# Patient Record
Sex: Male | Born: 1945 | Race: White | Hispanic: No | Marital: Married | Smoking: Never smoker
Health system: Southern US, Community
[De-identification: ages and names within clinical notes are randomized; demographics above are authoritative.]

## PROBLEM LIST (undated history)

## (undated) DIAGNOSIS — K589 Irritable bowel syndrome without diarrhea: Secondary | ICD-10-CM

## (undated) DIAGNOSIS — K449 Diaphragmatic hernia without obstruction or gangrene: Secondary | ICD-10-CM

## (undated) DIAGNOSIS — M1990 Unspecified osteoarthritis, unspecified site: Secondary | ICD-10-CM

## (undated) DIAGNOSIS — Z8719 Personal history of other diseases of the digestive system: Secondary | ICD-10-CM

## (undated) DIAGNOSIS — E785 Hyperlipidemia, unspecified: Secondary | ICD-10-CM

## (undated) DIAGNOSIS — I8390 Asymptomatic varicose veins of unspecified lower extremity: Secondary | ICD-10-CM

## (undated) DIAGNOSIS — I2510 Atherosclerotic heart disease of native coronary artery without angina pectoris: Secondary | ICD-10-CM

## (undated) DIAGNOSIS — I10 Essential (primary) hypertension: Secondary | ICD-10-CM

## (undated) DIAGNOSIS — D126 Benign neoplasm of colon, unspecified: Secondary | ICD-10-CM

## (undated) DIAGNOSIS — M6530 Trigger finger, unspecified finger: Secondary | ICD-10-CM

## (undated) DIAGNOSIS — K219 Gastro-esophageal reflux disease without esophagitis: Secondary | ICD-10-CM

## (undated) HISTORY — PX: TONSILLECTOMY: SUR1361

## (undated) HISTORY — DX: Essential (primary) hypertension: I10

## (undated) HISTORY — DX: Benign neoplasm of colon, unspecified: D12.6

## (undated) HISTORY — DX: Asymptomatic varicose veins of unspecified lower extremity: I83.90

## (undated) HISTORY — DX: Trigger finger, unspecified finger: M65.30

## (undated) HISTORY — PX: LUMBAR SPINE SURGERY: SHX701

## (undated) HISTORY — DX: Irritable bowel syndrome, unspecified: K58.9

## (undated) HISTORY — DX: Hyperlipidemia, unspecified: E78.5

## (undated) HISTORY — DX: Personal history of other diseases of the digestive system: Z87.19

## (undated) HISTORY — DX: Unspecified osteoarthritis, unspecified site: M19.90

## (undated) HISTORY — PX: HYDROCELE EXCISION / REPAIR: SUR1145

## (undated) HISTORY — DX: Diaphragmatic hernia without obstruction or gangrene: K44.9

## (undated) HISTORY — DX: Atherosclerotic heart disease of native coronary artery without angina pectoris: I25.10

## (undated) HISTORY — DX: Gastro-esophageal reflux disease without esophagitis: K21.9

---

## 1997-08-31 ENCOUNTER — Inpatient Hospital Stay (HOSPITAL_COMMUNITY): Admission: RE | Admit: 1997-08-31 | Discharge: 1997-09-01 | Payer: Self-pay | Admitting: Neurosurgery

## 1997-10-27 ENCOUNTER — Encounter: Admission: RE | Admit: 1997-10-27 | Discharge: 1998-01-25 | Payer: Self-pay | Admitting: Neurosurgery

## 2003-07-13 ENCOUNTER — Ambulatory Visit (HOSPITAL_COMMUNITY): Admission: RE | Admit: 2003-07-13 | Discharge: 2003-07-13 | Payer: Self-pay | Admitting: Urology

## 2003-07-13 ENCOUNTER — Ambulatory Visit (HOSPITAL_BASED_OUTPATIENT_CLINIC_OR_DEPARTMENT_OTHER): Admission: RE | Admit: 2003-07-13 | Discharge: 2003-07-13 | Payer: Self-pay | Admitting: Urology

## 2003-09-14 ENCOUNTER — Ambulatory Visit (HOSPITAL_COMMUNITY): Admission: RE | Admit: 2003-09-14 | Discharge: 2003-09-15 | Payer: Self-pay | Admitting: Neurosurgery

## 2006-11-06 ENCOUNTER — Ambulatory Visit: Payer: Self-pay | Admitting: Internal Medicine

## 2006-11-14 ENCOUNTER — Ambulatory Visit: Payer: Self-pay | Admitting: Internal Medicine

## 2007-11-27 DIAGNOSIS — K649 Unspecified hemorrhoids: Secondary | ICD-10-CM | POA: Insufficient documentation

## 2007-11-27 DIAGNOSIS — K589 Irritable bowel syndrome without diarrhea: Secondary | ICD-10-CM | POA: Insufficient documentation

## 2007-11-27 DIAGNOSIS — K219 Gastro-esophageal reflux disease without esophagitis: Secondary | ICD-10-CM | POA: Insufficient documentation

## 2007-11-27 DIAGNOSIS — D126 Benign neoplasm of colon, unspecified: Secondary | ICD-10-CM | POA: Insufficient documentation

## 2007-11-28 ENCOUNTER — Ambulatory Visit: Payer: Self-pay | Admitting: Internal Medicine

## 2007-11-28 DIAGNOSIS — R1013 Epigastric pain: Secondary | ICD-10-CM | POA: Insufficient documentation

## 2007-12-03 ENCOUNTER — Ambulatory Visit (HOSPITAL_COMMUNITY): Admission: RE | Admit: 2007-12-03 | Discharge: 2007-12-03 | Payer: Self-pay | Admitting: Internal Medicine

## 2007-12-23 ENCOUNTER — Ambulatory Visit: Payer: Self-pay | Admitting: Internal Medicine

## 2007-12-24 ENCOUNTER — Encounter: Payer: Self-pay | Admitting: Internal Medicine

## 2007-12-24 ENCOUNTER — Encounter: Admission: RE | Admit: 2007-12-24 | Discharge: 2007-12-24 | Payer: Self-pay | Admitting: Surgery

## 2007-12-24 ENCOUNTER — Telehealth: Payer: Self-pay | Admitting: Gastroenterology

## 2008-02-11 ENCOUNTER — Encounter: Payer: Self-pay | Admitting: Internal Medicine

## 2008-02-24 ENCOUNTER — Encounter: Payer: Self-pay | Admitting: Internal Medicine

## 2008-07-13 ENCOUNTER — Inpatient Hospital Stay (HOSPITAL_COMMUNITY): Admission: EM | Admit: 2008-07-13 | Discharge: 2008-07-14 | Payer: Self-pay | Admitting: Emergency Medicine

## 2008-07-13 ENCOUNTER — Ambulatory Visit: Payer: Self-pay | Admitting: Cardiology

## 2008-07-14 ENCOUNTER — Encounter: Payer: Self-pay | Admitting: Cardiology

## 2008-07-14 ENCOUNTER — Ambulatory Visit: Payer: Self-pay | Admitting: Surgery

## 2008-07-14 HISTORY — PX: CARDIAC CATHETERIZATION: SHX172

## 2008-07-14 HISTORY — PX: TRANSTHORACIC ECHOCARDIOGRAM: SHX275

## 2009-05-01 HISTORY — PX: CARDIAC CATHETERIZATION: SHX172

## 2010-05-31 NOTE — Procedures (Signed)
Summary: colonoscopy   Colonoscopy  Procedure date:  11/14/2006  Findings:      Results: Polyp.  Location:  [facility].    Comments:      Repeat colonoscopy in 5 years.   Procedures Next Due Date:    Colonoscopy: 11/2011 Patient Name: Joseph Reilly, Joseph Reilly. MRN:  Procedure Procedures: Colonoscopy CPT: 956 [id number].    with polypectomy. CPT: [id number].  Personnel: Endoscopist: Wilhemina Bonito. Marina Goodell, MD.  Exam Location: Exam performed in Outpatient Clinic. Outpatient  Patient Consent: Procedure, Alternatives, Risks and Benefits discussed, consent obtained, from patient. Consent was obtained by the RN.  Indications  Surveillance of: Adenomatous Polyp(s). This is an initial surveillance exam. Initial polypectomy was performed in 2004. in Jan. 1-2 Polyps were found at Index Exam. Largest polyp removed was 1 to 5 mm. Prior polyp located in proximal (splenic flexure and beyond) colon. Pathology of worst  polyp: tubular adenoma.  History  Current Medications: Patient is not currently taking Coumadin.  Pre-Exam Physical: Performed Nov 14, 2006. Cardio-pulmonary exam, Rectal exam, HEENT exam , Abdominal exam, Mental status exam WNL.  Comments: Pt. history reviewed/updated, physical exam performed prior to initiation of sedation?yes Exam Exam: Extent of exam reached: Cecum, extent intended: Cecum.  The cecum was identified by appendiceal orifice and IC valve. Patient position: on left side. Time to Cecum: 00:01:56. Time for Withdrawl: 00:12:04. Colon retroflexion performed. Images taken. ASA Classification: II. Tolerance: excellent.  Monitoring: Pulse and BP monitoring, Oximetry used. Supplemental O2 given.  Colon Prep Used Miralax for colon prep. Prep results: excellent.  Sedation Meds: Patient assessed and found to be appropriate for moderate (conscious) sedation. Fentanyl 75 mcg. given IV. Versed 8 mg. given IV.  Findings POLYP: Ascending Colon, Maximum size: 5 mm.  Procedure:  snare without cautery, removed, retrieved, Polyp sent to pathology. ICD9: Colon Polyps: 211.3.  NORMAL EXAM: Cecum to Rectum.  POLYP: Ascending Colon, Maximum size: 1 mm. Procedure:  snare without cautery, removed, not retrieved, ICD9: Colon Polyps: 211.3.   Assessment  Diagnoses: 211.3: Colon Polyps.   Events  Unplanned Interventions: No intervention was required.  Unplanned Events: There were no complications. Plans Disposition: After procedure patient sent to recovery. After recovery patient sent home.  Scheduling/Referral: Colonoscopy, to Wilhemina Bonito. Marina Goodell, MD, in 5 years,

## 2010-05-31 NOTE — Assessment & Plan Note (Signed)
Summary: RECURRENT ESOPHAGUS PROBS/MEDS/FH   History of Present Illness Visit Type: new patient Primary GI MD: Yancey Flemings MD Primary Provider: Lanell Persons MD Chief Complaint: reflux problems History of Present Illness:   65 year old white male who presents for evaluation of recurrent epigastric pain. The patient describes epigastric discomfort 6-10 times per year. He describes it as a "flushing feeling". There is occasional radiation to the back. Some soreness touch. No associated nausea or vomiting. He does have a history of gastroesophageal reflux disease for which he takes omeprazole. Occasional breakthrough symptoms with dietary indiscretion. No dysphagia. His last endoscopy was over 20 years ago. His last abdominal ultrasound over 10 years ago. Aside from occasional hemorrhoids, his GI review of systems is otherwise negative. He was having pain about 3 weeks ago when it is appointment. This discomfort lasted about a half a day and resolved. No recurrent symptoms since. He has undergone prior screening colonoscopy in July of 2008. The examination was normal except for diminutive colon polyps found to be adenomatous. Followup in 5 years recommended. Terms of his pain, he cannot identify relieving or aggravating factors. He does not keep him from sleeping. No fevers or weight loss.   GI Review of Systems    Reports acid reflux, belching, bloating, and  chest pain.      Denies abdominal pain, dysphagia with liquids, dysphagia with solids, heartburn, loss of appetite, nausea, vomiting, vomiting blood, weight loss, and  weight gain.      Reports hemorrhoids.     Denies anal fissure, black tarry stools, change in bowel habit, constipation, diarrhea, diverticulosis, fecal incontinence, heme positive stool, irritable bowel syndrome, jaundice, light color stool, liver problems, rectal bleeding, and  rectal pain.  Colonoscopy  Procedure date:  11/14/2006  Findings:      Diminutive adenomas  removed. Otherwise negative.     Prior Medications Reviewed Using: List Brought by Patient  Updated Prior Medication List: AMITRIPTYLINE HCL 25 MG  TABS (AMITRIPTYLINE HCL) 1/4 tab once daily PRILOSEC OTC 20 MG  TBEC (OMEPRAZOLE MAGNESIUM) 1 once daily NASALCROM 5.2 MG/ACT  AERS (CROMOLYN SODIUM) as needed  Current Allergies (reviewed today): No known allergies   Past Medical History:    Reviewed history from 11/27/2007 and no changes required:       Current Problems:        HEMORRHOIDS (ICD-455.6)       IBS (ICD-564.1)       ADENOMATOUS COLONIC POLYP (ICD-211.3)       GERD (ICD-530.81)       Hyperlipidemia  Past Surgical History:    Reviewed history from 11/27/2007 and no changes required:       tonsillectomy       lumbar spine surgery  1999       Hydrocele repair 2005   Family History:    Reviewed history and no changes required:       No FH of Colon Cancer:       Family History of Heart Disease: mother  Social History:    Reviewed history and no changes required:       Occupation: retired Printmaker       Patient has never smoked.        Alcohol Use - no       Daily Caffeine Use 1 per day       Illicit Drug Use - no       Patient does not get regular exercise.    Risk Factors:  Tobacco use:  never Drug use:  no Alcohol use:  no Exercise:  no  Colonoscopy History:     Date of Last Colonoscopy:  11/14/2006    Results:  Diminutive adenomas removed. Otherwise negative.    Review of Systems       Back pain, hearing problems, sleeping problems.All systems otherwise negative   Vital Signs:  Patient Profile:   65 Years Old Male Height:     71 inches Weight:      207.50 pounds BMI:     29.04 Pulse rate:   68 / minute Pulse rhythm:   regular BP sitting:   144 / 86  (left arm)  Vitals Entered By: Chales Abrahams CMA (November 28, 2007 9:08 AM)                  Physical Exam  General:     Well developed, well nourished, no acute distress. Head:      Normocephalic and atraumatic. Eyes:     PERRLA, no icterus. Nose:     No deformity, discharge,  or lesions. Mouth:     No deformity or lesions, dentition normal. Neck:     Supple; no masses or thyromegaly. Chest Wall:     Symmetrical,  no deformities . Lungs:     Clear throughout to auscultation. Heart:     Regular rate and rhythm; no murmurs, rubs,  or bruits. Abdomen:     Soft, nontender and nondistended. No masses, hepatosplenomegaly or hernias noted. Normal bowel sounds. Msk:     Symmetrical with no gross deformities. Normal posture. Pulses:     Normal pulses noted. Extremities:     No clubbing, cyanosis, edema or deformities noted. Neurologic:     Alert and  oriented x4;  grossly normal neurologically. Skin:     Intact without significant lesions or rashes. Psych:     Alert and cooperative. Normal mood and affect.    Impression & Recommendations:  Problem # 1:  ABDOMINAL PAIN-EPIGASTRIC (ICD-789.06) Recurrent problems with abdominal pain of uncertain cause. No worrisome features such as bleeding or weight loss. Rule out biliary colic, breakthrough reflux, symptomatic hiatal hernia, musculoskeletal discomfort.  PLAN: #1. Scheduled normal ultrasound to rule out gallstones #2. Scheduled endoscopy to exclude acid peptic disorders or more sinister diagnoses #3. Continue omeprazole #4. Reflux precautions  Problem # 2:  GERD (ICD-530.81) On omeprazole. May need dose adjustment if reflux felt to be a cause for symptoms. Endoscopy is planned above. No change in dosage at this time. Continue reflux precautions.  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  Other Orders: Ultrasound Abdomen (UAS)     ] Copy to Dr. Lanell Persons

## 2010-05-31 NOTE — Progress Notes (Signed)
  Phone Note Call from Patient   Caller: Patient Reason for Call: Talk to Doctor Summary of Call: ON CALL note. Pt callled at 3:10 am today. Pt. had EGD by Dr. Marina Goodell on Monday, no dilation. Notes back pain between his shoulder blades that woke him from sleep. No other complaints. Pt. and his wife relate that Dr. Marina Goodell told them that he had gallstone symtoms and should see a surgeon. Suspect biliary symptom or musculoskeletal are most likely. Advised to remain on clear liquids until the pain abates and to take ibuprofen 600mg  now. Call me or office later today for further advice from Dr. Marina Goodell. If pain worsens he should go to the ED for acute evaluation. Initial call taken by: Meryl Dare MD Clementeen Graham,  December 24, 2007 9:02 AM      Appended Document:  Elnita Maxwell, this patient has gallstones. He was seen yesterday in the endoscopy center. He was to have surgical referral. See if he has a surgical appointment yet. It may need to be moved up since he is experiencing acute symptoms. Possibly, they could see him today. Thanks  Appended Document:  12-24-07 Patient scheduled for an appt at [facility] today at 2:45pm as a work-in with Dr Jamey Ripa for severe abd pain, gallstones. Patient notified of appt and time. Records will be faxed to [facility]. Ulis Rias RN

## 2010-05-31 NOTE — Consult Note (Signed)
Summary: gallbladder problem/[facility]  gallbladder problem/[facility]   Imported By: Lester Buckhead 01/16/2008 11:57:13  _____________________________________________________________________  External Attachment:    Type:   Image     Comment:   External Document

## 2010-05-31 NOTE — Procedures (Signed)
Summary: Colonoscopy   EGD  Procedure date:  12/23/2007  Findings:      Location: [facility]     Patient Name: Joseph Reilly, Joseph Reilly. MRN:  Procedure Procedures: Panendoscopy (EGD) CPT: 43235.  Personnel: Endoscopist: Wilhemina Bonito. Marina Goodell, MD.  Referred By: Lanell Persons, MD.  Exam Location: Exam performed in Outpatient Clinic. Outpatient  Patient Consent: Procedure, Alternatives, Risks and Benefits discussed, consent obtained, from patient. Consent was obtained by the RN.  Indications Symptoms: Abdominal pain, location: epigastric. Reflux symptoms  History  Current Medications: Patient is not currently taking Coumadin.  Results of Prior Studies: ULTRASOUND on Dec 03, 2007: abnormal, do not suspect malignancy. GALLSTONES.   Pre-Exam Physical: Performed Dec 23, 2007  Cardio-pulmonary exam, HEENT exam, Abdominal exam, Mental status exam WNL.  Comments: Pt. history reviewed/updated, physical exam performed prior to initiation of sedation?yes Exam Exam Info: Maximum depth of insertion Duodenum, intended Duodenum. Patient position: on left side. Vocal cords visualized. Gastric retroflexion performed. Images taken. ASA Classification: II. Tolerance: excellent.  Sedation Meds: Patient assessed and found to be appropriate for moderate (conscious) sedation. Fentanyl 50 mcg. given IV. Versed 6 mg. given IV. Cetacaine Spray 2 sprays given aerosolized.  Monitoring: BP and pulse monitoring done. Oximetry used. Supplemental O2 given  Findings - Normal: Proximal Esophagus to Distal Esophagus.  HIATAL HERNIA: ICD9: Hernia, Hiatal: 553.3.Comments: small.  - Normal: Cardia to Duodenal 2nd Portion.   Assessment  Diagnoses: 553.3: Hernia, Hiatal. SMALL.  530.81: GERD. NO BARRETT'S.   Comments: SUSPECT RECURRENT ABDOMINAL PAIN DUE TO GALLSTONES Events  Unplanned Intervention: No unplanned interventions were required.  Unplanned Events: There were no  complications. Plans Comments: CONTINUE DAILY PPI FOR KNOWN GERD Disposition: After procedure patient sent to recovery. After recovery patient sent home.  Comments: REFER TO GENERAL SURGERY "ABD PAIN,GALLSTONES,LAP-CHOLE"    cc:  Lanell Persons, MD       [facility]   This report was created from the original endoscopy report, which was reviewed and signed by the above listed endoscopist.

## 2010-06-03 NOTE — Letter (Signed)
Summary: neck pa,arm & hand numbness/[facility]  neck pa,arm & hand numbness/[facility]   Imported By: Lester Byhalia 02/25/2008 12:16:36  _____________________________________________________________________  External Attachment:    Type:   Image     Comment:   External Document

## 2010-06-03 NOTE — Letter (Signed)
Summary: cervical/lumbar strain/[facility]  cervical/lumbar strain/[facility]   Imported By: Lester Chowan 03/05/2008 08:22:35  _____________________________________________________________________  External Attachment:    Type:   Image     Comment:   External Document

## 2010-08-11 LAB — DIFFERENTIAL
Basophils Absolute: 0 10*3/uL (ref 0.0–0.1)
Basophils Absolute: 0.1 10*3/uL (ref 0.0–0.1)
Basophils Relative: 0 % (ref 0–1)
Basophils Relative: 1 % (ref 0–1)
Eosinophils Absolute: 0.2 10*3/uL (ref 0.0–0.7)
Eosinophils Absolute: 0.1 10*3/uL (ref 0.0–0.7)
Eosinophils Relative: 2 % (ref 0–5)
Eosinophils Relative: 1 % (ref 0–5)
Lymphocytes Relative: 17 % (ref 12–46)
Lymphocytes Relative: 14 % (ref 12–46)
Lymphs Abs: 1.4 10*3/uL (ref 0.7–4.0)
Lymphs Abs: 1.5 10*3/uL (ref 0.7–4.0)
Monocytes Absolute: 0.8 10*3/uL (ref 0.1–1.0)
Monocytes Absolute: 0.9 10*3/uL (ref 0.1–1.0)
Monocytes Relative: 10 % (ref 3–12)
Monocytes Relative: 8 % (ref 3–12)
Neutro Abs: 6 10*3/uL (ref 1.7–7.7)
Neutro Abs: 8.1 10*3/uL — ABNORMAL HIGH (ref 1.7–7.7)
Neutrophils Relative %: 72 % (ref 43–77)
Neutrophils Relative %: 75 % (ref 43–77)

## 2010-08-11 LAB — BASIC METABOLIC PANEL
BUN: 14 mg/dL (ref 6–23)
CO2: 25 mEq/L (ref 19–32)
Calcium: 8.8 mg/dL (ref 8.4–10.5)
Chloride: 106 mEq/L (ref 96–112)
Creatinine, Ser: 0.92 mg/dL (ref 0.4–1.5)
GFR calc Af Amer: >60 mL/min (ref >60)
GFR calc non Af Amer: >60 mL/min (ref >60)
Glucose, Bld: 103 mg/dL — ABNORMAL HIGH (ref 70–99)
Potassium: 3.8 mEq/L (ref 3.5–5.1)
Sodium: 138 mEq/L (ref 135–145)

## 2010-08-11 LAB — BRAIN NATRIURETIC PEPTIDE
Pro B Natriuretic peptide (BNP): 68 pg/mL (ref 0.0–100.0)
Pro B Natriuretic peptide (BNP): 52 pg/mL (ref 0.0–100.0)

## 2010-08-11 LAB — D-DIMER, QUANTITATIVE: D-Dimer, Quant: 0.58 ug/mL-FEU — ABNORMAL HIGH (ref 0.00–0.48)

## 2010-08-11 LAB — POCT I-STAT, CHEM 8
BUN: 17 mg/dL (ref 6–23)
Calcium, Ion: 1.14 mmol/L (ref 1.12–1.32)
Chloride: 104 mEq/L (ref 96–112)
Creatinine, Ser: 1 mg/dL (ref 0.4–1.5)
Glucose, Bld: 99 mg/dL (ref 70–99)
HCT: 46 % (ref 39.0–52.0)
Hemoglobin: 15.6 g/dL (ref 13.0–17.0)
Potassium: 3.8 mEq/L (ref 3.5–5.1)
Sodium: 140 mEq/L (ref 135–145)
TCO2: 26 mmol/L (ref 0–100)

## 2010-08-11 LAB — POCT CARDIAC MARKERS
CKMB, poc: <1 ng/mL — ABNORMAL LOW (ref 1.0–8.0)
CKMB, poc: <1 ng/mL — ABNORMAL LOW (ref 1.0–8.0)
Myoglobin, poc: 68.5 ng/mL (ref 12–200)
Myoglobin, poc: 81.1 ng/mL (ref 12–200)
Troponin i, poc: <0.05 ng/mL (ref 0.00–0.09)
Troponin i, poc: <0.05 ng/mL (ref 0.00–0.09)

## 2010-08-11 LAB — CARDIAC PANEL(CRET KIN+CKTOT+MB+TROPI)
CK, MB: 0.9 ng/mL (ref 0.3–4.0)
CK, MB: 1 ng/mL (ref 0.3–4.0)
Relative Index: INVALID (ref 0.0–2.5)
Relative Index: INVALID (ref 0.0–2.5)
Total CK: 53 U/L (ref 7–232)
Total CK: 57 U/L (ref 7–232)
Troponin I: <0.01 ng/mL (ref 0.00–0.06)
Troponin I: <0.01 ng/mL (ref 0.00–0.06)

## 2010-08-11 LAB — ANTI-NUCLEAR AB-TITER (ANA TITER): ANA Titer 1: NEGATIVE

## 2010-08-11 LAB — PROTIME-INR
INR: 1.1 (ref 0.00–1.49)
Prothrombin Time: 14.5 seconds (ref 11.6–15.2)

## 2010-08-11 LAB — CBC
HCT: 43.7 % (ref 39.0–52.0)
HCT: 44.4 % (ref 39.0–52.0)
Hemoglobin: 15.4 g/dL (ref 13.0–17.0)
Hemoglobin: 15.4 g/dL (ref 13.0–17.0)
MCHC: 35.2 g/dL (ref 30.0–36.0)
MCHC: 34.7 g/dL (ref 30.0–36.0)
MCV: 97.1 fL (ref 78.0–100.0)
MCV: 98 fL (ref 78.0–100.0)
Platelets: 148 10*3/uL — ABNORMAL LOW (ref 150–400)
Platelets: 169 10*3/uL (ref 150–400)
RBC: 4.5 MIL/uL (ref 4.22–5.81)
RBC: 4.53 MIL/uL (ref 4.22–5.81)
RDW: 13.5 % (ref 11.5–15.5)
RDW: 13.3 % (ref 11.5–15.5)
WBC: 8.4 10*3/uL (ref 4.0–10.5)
WBC: 10.8 10*3/uL — ABNORMAL HIGH (ref 4.0–10.5)

## 2010-08-11 LAB — LIPID PANEL
Cholesterol: 156 mg/dL (ref 0–200)
HDL: 36 mg/dL — ABNORMAL LOW (ref >39)
LDL Cholesterol: 108 mg/dL — ABNORMAL HIGH (ref 0–99)
Total CHOL/HDL Ratio: 4.3 RATIO
Triglycerides: 60 mg/dL (ref <150)
VLDL: 12 mg/dL (ref 0–40)

## 2010-08-11 LAB — CK TOTAL AND CKMB (NOT AT ARMC)
CK, MB: 0.6 ng/mL (ref 0.3–4.0)
Relative Index: INVALID (ref 0.0–2.5)
Total CK: 47 U/L (ref 7–232)

## 2010-08-11 LAB — TSH: TSH: 0.874 u[IU]/mL (ref 0.350–4.500)

## 2010-08-11 LAB — TROPONIN I: Troponin I: <0.01 ng/mL (ref 0.00–0.06)

## 2010-08-11 LAB — APTT: aPTT: 34 seconds (ref 24–37)

## 2010-08-11 LAB — ANA: Anti Nuclear Antibody(ANA): POSITIVE — AB

## 2010-09-13 NOTE — H&P (Signed)
NAME:  Joseph Reilly, Joseph Reilly                 ACCOUNT NO.:  [id number]   MEDICAL RECORD NO.:  [id number]          PATIENT TYPE:  INP   LOCATION:  3729                         FACILITY:  [facility]   PHYSICIAN:  Darryl D. Prime, MD    DATE OF BIRTH:  08/20/1945   DATE OF ADMISSION:  07/13/2008  DATE OF DISCHARGE:                              HISTORY & PHYSICAL   The patient is full code.   PRIMARY CARE PHYSICIAN:  Vikki Ports, M.D., Deboraha Sprang.   CARDIOLOGIST:  None.   The Urgent Care physician who saw him previously on the day of admission  did discuss his care with Dr. Deborah Chalk.   CHIEF COMPLAINT:  Chest pain.   HISTORY OF PRESENT ILLNESS:  Joseph Reilly is a 65 year old male with a  history of hyperlipidemia, hypertension, noted chest pain at waking up  this morning, July 13, 2008.  Describe as a sharp, pressure sensation  that is constant, worse with movement, inspiration, or lying flat.  The  patient notes the symptoms resolved when he sits up.  He notes no sweats  associated with shortness of breath or recent illness particularly of  the upper respiratory tract.  He took ibuprofen, which helped symptoms.  He called his primary care physician's office and they had him come to  the Urgent Care Clinic around 5:30 p.m.  Dr. Tenny Craw was there at the time.  EKG was performed, and he was transferred to the emergency room.  EMS  gave him 81 mg of aspirin x4.  Sublingual nitroglycerin was given at  Urgent Care and by EMS with minimal improvement of the symptoms.  The  patient notes slight persistent discomfort at the time of the interview.  In the emergency room, labs were ordered and a chest CT was ordered.   ALLERGIES:  No known drug allergies.   MEDICATIONS:  He is on amitriptyline.  He is unsure of the dose and he  is on Prilosec OTC 20 mg daily.  Recently placed on vitamin D at 1000  int. Units twice a day.   PAST MEDICAL HISTORY AND PAST SURGICAL HISTORY:  1. Hypertension.  2.  Hyperlipidemia.  3. He is status post right L4-L5 microdiskectomy for herniated disk in      2005.  He had a prior similar surgery in 1999.  4. History of varicose veins.  5. History of gallstones that have not been removed.  6. History of a small hiatal hernia.  7. History of gastroesophageal reflux disease.  8. History of testicular hydrocele.  9. History of vitamin D deficiency.  10.Status post tonsillectomy.   SOCIAL HISTORY:  He is retired from the retired [employer].  No  alcohol.  Never smoked.   FAMILY HISTORY:  Positive for mother who died of an MI in her 109s.  Father had a thoracic aortic aneurysm and died on the operating table.  Brother has coronary artery disease in his 71s.   REVIEW OF SYSTEMS:  A 14-point review of systems is negative as stated  above.  He denies any TB exposure.  He notes  recent thick area on his  calf area and posterior thigh on the right recently.   PHYSICAL EXAMINATION:  VITAL SIGNS:  Blood pressure is 116/71 with a  respiratory rate of 16, pulse 87, afebrile with sats 96% on room air.  GENERAL:  He is a male who looks his stated age, sitting upright in bed,  in no acute distress.  HEENT:  Normocephalic and atraumatic.  Pupils are equal, round, and  reactive to light with extraocular movements being intact.  The  oropharynx was dry.  NECK:  Supple with no lymphadenopathy or thyromegaly.  No carotid  bruits.  No jugular venous distention.  CARDIOVASCULAR:  Regular rhythm and rate with no murmurs, rubs, or  gallops.  Normal S1 and S2.  No S3 or S4.  He does seem to have a rub  present in systole.  LUNGS:  Clear to auscultation bilaterally with no wheezes, rales, or  rhonchi.  SKIN:  No rashes.  ABDOMEN:  Soft, nontender, nondistended with no hepatosplenomegaly.  EXTREMITIES:  No clubbing, cyanosis, or edema.  He has trace lower  extremity edema.  He has multiple varicosities, possible wart under the  right hamstrings.  NEUROLOGIC:  He  is alert and oriented x4.  Cranial nerves II through XII  are grossly intact.  Strength and sensation are grossly intact.   EKG showed a normal sinus rhythm with a rate of 85 beats per minute, PR  interval 155, QRS 98, QT corrected 435 with significant early  repolarization versus an ST elevation inferiorly more so, but it is  diffuse.  The ST elevations were more prominent compared to EKG in  November 2003.   LABORATORY DATA:  Chest x-ray showed cardiomegaly, but no pulmonary  edema.  Labs showed a white count of 10.8 with a hemoglobin of 16.8,  hematocrit 44.4, platelets 169.  Sodium 140 with a potassium of 3.8,  chloride 104, bicarb 26, BUN 17 with a creatinine 1.0, glucose 99.  Cardiac markers 20, 19 were negative.  INR was 1.1 with a PT of 14.5,  PTT 34, d-dimer 0.58.  Echo probe was placed over the sternum and  subxiphoid area showing no gross pericardial effusion.   ASSESSMENT AND PLAN:  This is a patient who presents with chest pain  consistent with possible pleurisy and/or pericarditis, who has acute  coronary syndrome and pulmonary embolus.  At this time, he will be  admitted.  He used to get a chest CT for [place] to rule out pulmonary  embolus and to rule out acute coronary syndrome.  We gave him aspirin  daily and placed him on telemetry and he will get cardiac markers x3.  He may benefit from ischemic evaluation and evaluation of his ejection  fraction.  The patient's lipids will be checked with a concern for  pericarditis.  We will check TSH.  We are concerned for pericarditis.  We will check ANA and PPD.  We will rule out life-threatening disorders  in differential and if they are negative, we will suggest treatment for  possible pericarditis.  We will get an echocardiogram tomorrow.      Darryl D. Prime, MD  Electronically Signed    DDP/MEDQ  D:  07/14/2008  T:  07/14/2008  Job:  [id number]

## 2010-09-13 NOTE — Discharge Summary (Signed)
NAMECINQUE, BEGLEY                 ACCOUNT NO.:  [id number]   MEDICAL RECORD NO.:  [id number]          PATIENT TYPE:  INP   LOCATION:  3729                         FACILITY:  [facility]   PHYSICIAN:  Colleen Can. Deborah Chalk, M.D.DATE OF BIRTH:  1945/12/31   DATE OF ADMISSION:  07/13/2008  DATE OF DISCHARGE:  07/14/2008                               DISCHARGE SUMMARY   DISCHARGE DIAGNOSES:  1. Chest pain with negative cardiac enzymes, negative CT scan, and      subsequent elective cardiac catheterization showing less than 30%      narrowings of the left main, less than 30% scattered irregularities      of the LAD, less than 30% irregularities in the left circumflex,      and 30-40% narrowings in the dominant right coronary artery with      normal left ventricular function.  The patient will be managed      medically.  2. Hypertension.  3. Hyperlipidemia.  4. History of gastroesophageal reflux disease.  5. History of gallstones.   HISTORY OF PRESENT ILLNESS:  The patient is a 65 year old white male who  has really not felt well since August of last year.  He has had some  degree of weakness and fatigue.  He was admitted to the hospital  yesterday evening after an episode of chest discomfort.  This actually  woke him up early in the morning at approximately 3 a.m.  He described  this as a sharp, pressure-like sensation that was constant.  It was  worse with movement as well as inspiration.  He had no diaphoresis and  no shortness of breath.  He actually took two ibuprofen, which did help.  He was seen at the Urgent Care later on that day where an EKG was done  and the patient was felt to need further evaluation.  He was  subsequently brought to the emergency room where he was treated with  aspirin.  He was given sublingual nitroglycerin en route with some  relief.  He was subsequently seen and evaluated for admission.   Please see the history and physical for further patient presentation  and  profile.   LABORATORY DATA:  His EKG showed sinus rhythm.  His hematocrit was 44,  white count 10.8, platelets are 169.  Chemistries were normal with a BUN  of 17.  Creatinine was 1.  Glucose 99.  D-dimer was low at 0.5.  Cardiac  enzymes were negative.  His chest x-ray showed no acute changes.  There  was concern for early repolarization on his EKG as well as the  possibility of pericarditis.   HOSPITAL COURSE:  The patient was admitted.  He was placed on his home  medicines.  He ruled out negative for myocardial infarction per serial  enzymes.  We proceeded on with a CT scan of chest, which showed no  pulmonary embolus, no dissection, and no pericardial effusion.  All  cardiac enzymes were negative.  We proceeded on with cardiac  catheterization later on that day, those results are as noted above.  The patient has also  had a 2-D echocardiogram, which showed normal left  ventricular function at 60%, left atrial size was at the upper limits of  normal, but overall the study was satisfactory.  Following his cardiac  catheterization, he was transferred back to the floor.  Plans are now  being made for him to be discharged later on this evening with plans for  outpatient followup.   DISCHARGE CONDITION:  Stable.   DISCHARGE DIET:  Low-salt heart-healthy.   WOUND CARE:  He is to use an ice pack if needed to the groin.   ACTIVITY:  To increase as tolerated.   DISCHARGE MEDICATIONS:  1. Plavix 75 mg a day for 1 month.  2. Enteric-coated aspirin 325 mg a day.  3. Lipitor 80 mg a day.  4. He may resume his amitriptyline as he was taking before.  5. He will use Protonix 40 mg a day in the place of his over-the-      counter Prilosec.   I plan on seeing him back in the office in 1 week, certainly sooner if  any problems arise in the interim.      Sharlee Blew, N.P.      Colleen Can. Deborah Chalk, M.D.  Electronically Signed    LC/MEDQ  D:  07/14/2008  T:  07/15/2008  Job:   [id number]   cc:   Vikki Ports, M.D.

## 2010-09-13 NOTE — Cardiovascular Report (Signed)
NAMEABRAR, BILTON                 ACCOUNT NO.:  [id number]   MEDICAL RECORD NO.:  [id number]          PATIENT TYPE:  INP   LOCATION:  3729                         FACILITY:  [facility]   PHYSICIAN:  Colleen Can. Deborah Chalk, M.D.DATE OF BIRTH:  1945/12/28   DATE OF PROCEDURE:  07/14/2008  DATE OF DISCHARGE:  07/14/2008                            CARDIAC CATHETERIZATION   HISTORY:  Mr. Dickerman presents with increasing chest pain.  He has had  indigestion-like symptoms and substernal discomfort over the last 4  months that have been progressive.  He is referred for catheterization.  EKG changes inferiorly were equivocal.   PROCEDURE:  Left heart catheterization with selective coronary  angiography and left ventricular angiography.   TYPE AND SITE OF ENTRY:  Percutaneous right femoral artery with Angio-  Seal.   CATHETERS:  A 6-French, 4-curved Judkins right and left coronary  catheters, 6-French pigtail ventriculographic catheter.   CONTRAST MATERIAL:  Omnipaque.   MEDICATIONS GIVEN PRIOR TO PROCEDURE:  None.   MEDICATIONS GIVEN DURING THE PROCEDURE:  Versed 3 mg IV.   COMMENT:  The patient tolerated the procedure well.   HEMODYNAMIC DATA:  Aortic pressure is 113/71, LV was 105/3-6.  There was  no aortic valve gradient noted on pullback.   ANGIOGRAPHIC DATA:  1. The left main coronary artery had mild irregularities of less than      20-30%.   1. The left anterior descending was a reasonably large vessel that      crossed the apex.  There were scattered irregularities throughout      the length of the vessel all less than 30% luminal narrowing.      There was a moderately large first diagonal vessel.   1. Left circumflex.  The left circumflex continued as a reasonably      large vessel with a large bifurcating marginal vessel.  There were      irregularities in the left circumflex of no greater than 30%      narrowing.  There was no significant obstructive disease.  It was a  large codominant circulation.   1. Right coronary artery.  The right coronary artery was a codominant      vessel.  There was somewhat scattered and diffuse 30-40% narrowing.      There was a small posterior descending vessel.  All of the      atherosclerosis was nonobstructive.   1. The left ventricular angiogram was performed in the RAO position.      Overall cardiac size and silhouette were normal.  The global      ejection fraction was estimated to be 60%.  Regional wall motion is      normal.   OVERALL IMPRESSION:  1. Normal left ventricular function.  2. Somewhat diffuse nonobstructive 3-vessel coronary atherosclerosis.   DISCUSSION:  At this point in time, Mr. Brunsman does not appear to have  significant obstructive coronary disease.  However, he needs to be  treated aggressively because he definitely does have coronary  atherosclerosis.  We will need to try to achieve LDL cholesterol levels  of  approximately 70.  He will be on aspirin and Plavix therapy over the  first month.  He will need to remain on aspirin thereafter.  Other  cardiovascular risk factors will need to be modified.      Colleen Can. Deborah Chalk, M.D.  Electronically Signed     SNT/MEDQ  D:  07/14/2008  T:  07/15/2008  Job:  [id number]   cc:   Vikki Ports, M.D.

## 2010-09-16 NOTE — Op Note (Signed)
NAME:  Joseph Reilly, Joseph Reilly                           ACCOUNT NO.:  [id number]   MEDICAL RECORD NO.:  [id number]                   PATIENT TYPE:  AMB   LOCATION:  [facility]                                 FACILITY:  [facility]   PHYSICIAN:  Bertram Millard. Dahlstedt, M.D.          DATE OF BIRTH:  10/13/1945   DATE OF PROCEDURE:  07/13/2003  DATE OF DISCHARGE:                                 OPERATIVE REPORT   PREOPERATIVE DIAGNOSIS:  Large left hydrocele.   POSTOPERATIVE DIAGNOSIS:  Large left hydrocele.   OPERATION/PROCEDURE:  Left hydrocelectomy.   SURGEON:  Bertram Millard. Dahlstedt, M.D.   ANESTHESIA:  General with LMA.   COMPLICATIONS:  None.   BRIEF HISTORY:  This nice 65 year old gentleman has been followed by me for  sometime with a left hydrocele.  At first, I recommended conservative  management.  He was found to have a simple left hydrocele by ultrasound.  As  a patient, has had persistent symptoms from this since I first saw him back  in summer of 2004.  He desires surgery.  The risks and complications of  hydrocelectomy have been discussed with the patient.  He understands these  and agrees to proceed.   DESCRIPTION OF PROCEDURE:  The patient was given a general anesthetic and  placed in the recumbent position.  Genitalia and perineum were prepped and  draped.  An incision was made in the median raphe of the scrotum anteriorly.  Dissection was carried down to the left tunica vaginalis.  The hydrocele was  quite large. It was obvious that I would have to do a fair amount of  dissection around the tunica.  The tunica was skeletonized, taking great  care to cauterize all small vessels that were investing the tunica  vaginalis.  The hydrocele was eventually delivered from the scrotum.  The  incision did have to be enlarged somewhat to enable this to occur.  Again,  small vessels were carefully controlled with electrocautery.  Dissection was  carried up superiorly along the hydrocele.  It was  quite large and pear-  shaped.  Once the entire hydrocele was dissected, it was opened anteriorly.  The clear fluid was then drained.  Approximately 300 mL of fluid was  drained.  At this point the cord was blocked with 10 mL of 0.25% plain  Marcaine.  The entire anterior part of the hydrocele was opened up and it  was imbricated posteriorly with a running 2-0 chromic.  This everted the  hydrocele sac nicely.  None was resected.  At this point the scrotum was  then carefully inspected.  Small bleeders were electrocoagulated.  The skin  actually was the part that was bleeding the most and the small vessels were  eventually controlled on the skin.  At this point the testicle and hydrocele  sac, which had been drained, were returned to the scrotum.  The scrotum was  then again inspected and  found to be hemostatic.  This puncture wound was  made in the lower part of the left hemiscrotum and a one-half inch Penrose  drain was brought up through this into the hemiscrotum.  The scrotum was  then closed in two layers, with the dartos closure of 2-0 chromic and a skin  suture done subcuticularly of 4-0 chromic.  The drain was clipped outside  with a safety pin.  Dry sterile dressings/fluff dressings were placed  underneath a scrotal support.   The patient tolerated the procedure well.  Estimated blood loss was  approximately 15 mL.   He will be discharged on Vicodin #25 one to two p.o. q.4h. p.r.n. pain.  He  will be told to remove his drain on Tuesday morning.  He will follow up with  me as scheduled in the office.                                               Bertram Millard. Dahlstedt, M.D.    SMD/MEDQ  D:  07/13/2003  T:  07/13/2003  Job:  [id number]

## 2010-09-16 NOTE — Op Note (Signed)
NAME:  HENCE, Joseph Reilly                           ACCOUNT NO.:  [id number]   MEDICAL RECORD NO.:  [id number]                   PATIENT TYPE:  OIB   LOCATION:  3038                                 FACILITY:  [facility]   PHYSICIAN:  Cristi Loron, M.D.            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   OPERATION PERFORMED:  Right L4-5 microdiskectomy using microdissection.   SURGEON:  Cristi Loron, M.D.   ASSISTANT:  Hilda Lias, M.D.   ANESTHESIA:  General endotracheal.   ESTIMATED BLOOD LOSS:  50 mL.   SPECIMENS:  None.   DRAINS:  None.   COMPLICATIONS:  None.   INDICATIONS FOR PROCEDURE:  The patient is a 65 year old white male who has  suffered from back and right leg pain.  He has failed medical management and  was worked up with a lumbar MRI which demonstrated a herniated disk at L4-5  on the right.  I discussed the various treatment options with him including  surgery.  The patient weighed the risks, benefits and alternatives to  surgery and decided to proceed with microdiskectomy.   DESCRIPTION OF PROCEDURE:  The patient was brought to the operating room by  the anesthesia team.  General endotracheal anesthesia was induced.  The  patient was then turned to the prone position on the Wilson frame.  His  lumbosacral region was then prepared with Betadine scrub and Betadine  solution and sterile drapes were applied.  I then injected the area to be  incised with Marcaine with epinephrine solution.  I used a scalpel to make a  linear midline incision over the L4-5 interspace.  I used electrocautery to  dissect down to the thoracolumbar fascia, divided the  fascia just to the  right of the midline performing a right-sided subperiosteal dissection  stripping the paraspinous musculature from the right spinous process and  lamina of L4 and L5.  I encountered some scar tissue from his prior surgery.  I inserted the McCullough retractor for exposure and then obtained  intraoperative radiograph to confirm our location.  We then brought the  operating microscope into the field and under its magnification and  illumination, completed the microdissection/decompression.  We  used a high  speed drill to perform a right L4 laminotomy.  We then widened the  laminotomy with a Kerrison punch, removing the right L4-5 ligamentum flavum.  I then used microdissection to free up the thecal sac and the right L5 nerve  root from the epidural tissue.  Dr. Jeral Fruit gently retracted the nerve root  and thecal sac medially with a D'Errico retractor.  This exposed a free  fragment disk herniation which had migrated caudally and was compressing the  right L5 nerve root just as it entered into the neural foramen.  I used  microdissection to free up the disk herniation and then remove it in  multiple fragments using the pituitary forceps.  I then inspected the L4-5  intervertebral disk.  There did not appear to be any impending herniations.  Nor was there any significant compression of the thecal sac or the L5 nerve  root.  We therefore did not enter the intervertebral disk space or violate  the annulus fibrosis.  We then achieved hemostasis using bipolar  electrocautery.  We palpated along the ventral surface of the thecal sac and  along the extralateral nerve of the L5 nerve root and it was well  decompressed all the way out into the neural foramen.  I then removed the  McCullough retractor and then reapproximated the patient's thoracolumbar  fascia with interrupted #1Vicryl sutures, subcutaneous tissue with  interrupted 2-0 Vicryl suture and the skin with Steri-Strips and  Benzoin.  The wound was then coated with bacitracin ointment, sterile dressing was  applied, the drapes were removed.  The patient was subsequently returned to  supine position where he was extubated by the anesthesia team and  transported to the post anesthesia care unit in stable condition.  All  sponge, needle and instrument counts were correct at the end of the case.                                               Cristi Loron, M.D.    JDJ/MEDQ  D:  09/14/2003  T:  09/15/2003  Job:  [id number]

## 2011-05-15 ENCOUNTER — Encounter: Payer: Self-pay | Admitting: Internal Medicine

## 2011-05-17 ENCOUNTER — Ambulatory Visit (INDEPENDENT_AMBULATORY_CARE_PROVIDER_SITE_OTHER): Payer: PRIVATE HEALTH INSURANCE | Admitting: Internal Medicine

## 2011-05-17 ENCOUNTER — Encounter: Payer: Self-pay | Admitting: Internal Medicine

## 2011-05-17 VITALS — BP 134/80 | HR 60 | Ht 71.0 in | Wt 195.0 lb

## 2011-05-17 DIAGNOSIS — K219 Gastro-esophageal reflux disease without esophagitis: Secondary | ICD-10-CM

## 2011-05-17 DIAGNOSIS — R1013 Epigastric pain: Secondary | ICD-10-CM

## 2011-05-17 DIAGNOSIS — R1011 Right upper quadrant pain: Secondary | ICD-10-CM

## 2011-05-17 DIAGNOSIS — Z86010 Personal history of colonic polyps: Secondary | ICD-10-CM

## 2011-05-17 NOTE — Progress Notes (Signed)
HISTORY OF PRESENT ILLNESS:  Joseph Reilly is a 66 y.o. male with hypertension and hyperlipidemia. 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 He presents today for evaluation of recurrent upper abdominal discomfort. He is accompanied by his wife. His last office evaluation was performed 11/28/2007 regarding recurrent epigastric discomfort. See that dictation. Upper endoscopy was performed and found to be unremarkable. Symptoms persisted despite PPI. Abdominal ultrasound suggested gallstones for which he was referred to general surgery. I'm told that his symptoms were felt to be unrelated to his gallbladder and no plans for cholecystectomy made. He has had some epigastric and right upper quadrant fullness or pressure sensation. Occasional radiation into the back. Occasional sensation of shortness of breath. Symptoms last 3-4 hours, typically. He does not describe this as pain. He is concerned about cardiac disease. Wife tells me that he had a cardiac catheterization since his last visit here. Apparently nonobstructive disease. They inquire about repeat cardiac evaluation. No active GERD symptoms. No issues with his bowel habits. Surveillance colonoscopy up to date. Prior colonoscopies in 2004 and 2008. Due for surveillance colonoscopy around July 2013. No change in bowel habits. Otherwise negative GI review of systems.  REVIEW OF SYSTEMS:  All non-GI ROS negative except for sinus an allergy trouble, back pain, arthritis, hearing problems, insomnia  Past Medical History  Diagnosis Date  . GERD (gastroesophageal reflux disease)   . Hemorrhoids   . IBS (irritable bowel syndrome)   . Adenomatous polyp of colon   . Hyperlipemia   . Hiatal hernia     small   . Varicose veins   . Arthritis   . Hypertension   . History of gallstones     Past Surgical History  Procedure Date  . Tonsillectomy   . Lumbar  spine surgery   . Hydrocele excision / repair   . Cardiac catheterization 2011    Social History Joseph Reilly  reports that he has never smoked. He has never used smokeless tobacco. He reports that he does not drink alcohol or use illicit drugs.  family history includes Diabetes (age of onset:80) in his father and Heart disease in his mother.  There is no history of Colon cancer.  No Known Allergies     PHYSICAL EXAMINATION: Vital signs: BP 134/80  Pulse 60  Ht 5\' 11"  (1.803 m)  Wt 195 lb (88.451 kg)  BMI 27.20 kg/m2 General: Well-developed, well-nourished, no acute distress HEENT: Sclerae are anicteric, conjunctiva pink. Oral mucosa intact Lungs: Clear Heart: Regular Abdomen: soft, nontender, nondistended, no obvious ascites, no peritoneal signs, normal bowel sounds. No organomegaly. Extremities: No edema Psychiatric: alert and oriented x3. Cooperative    ASSESSMENT:  #1. Ongoing recurrent epigastric and right upper quadrant fullness/discomfort. Prior GI workup negative except for possible gallstones. #2. GERD. No symptoms on PPI #3. History of adenomatous colon polyps. Due for followup surveillance July 2013   PLAN:  #1. Continue PPI #2. Continue reflux precautions #3. Talk to her PCP regarding the need for additional cardiac assessment #4. Repeat abdominal ultrasound. If Clear-cut gallstones, consider repeat surgical opinion #5. He plans for surveillance colonoscopy July 2013. They are aware.

## 2011-05-17 NOTE — Patient Instructions (Signed)
You have been scheduled for an abdominal ultrasound at [facility] 05-23-11 at 8:00am, 7:45 am arrival time.  Please do not eat or drink anything after midnight

## 2011-05-23 ENCOUNTER — Ambulatory Visit (HOSPITAL_COMMUNITY)
Admission: RE | Admit: 2011-05-23 | Discharge: 2011-05-23 | Disposition: A | Discharge status: Home / Self Care | Payer: No Typology Code available for payment source | Source: Ambulatory Visit | Attending: Internal Medicine | Admitting: Internal Medicine

## 2011-05-23 ENCOUNTER — Telehealth: Payer: Self-pay

## 2011-05-23 DIAGNOSIS — R1011 Right upper quadrant pain: Secondary | ICD-10-CM | POA: Insufficient documentation

## 2011-05-23 NOTE — Telephone Encounter (Signed)
Message copied by Michele Mcalpine on Tue May 23, 2011  4:41 PM ------      Message from: Hilarie Fredrickson      Created: Tue May 23, 2011  1:39 PM       Let pt know US shows tiny poly vs non-shadowing stone. I don't think that this is causing pain.

## 2011-05-23 NOTE — Telephone Encounter (Signed)
Spoke with pt and he is aware of results per Dr. Marina Goodell. Pt wants to know if Dr. Marina Goodell has any further recommendations or if he needs a follow-up visit. Dr. Marina Goodell please advise.

## 2011-05-23 NOTE — Telephone Encounter (Signed)
No new plans. He was to keep his f/u w/ PCP with possible Cardiology check-up. No specific GI follow up needed, unless he feels that he would like to follow up.

## 2011-05-24 NOTE — Telephone Encounter (Signed)
Spoke with pt and he is aware. 

## 2011-06-08 ENCOUNTER — Encounter: Payer: Self-pay | Admitting: *Deleted

## 2011-06-13 ENCOUNTER — Ambulatory Visit (INDEPENDENT_AMBULATORY_CARE_PROVIDER_SITE_OTHER): Payer: No Typology Code available for payment source | Admitting: Cardiology

## 2011-06-13 ENCOUNTER — Encounter: Payer: Self-pay | Admitting: Cardiology

## 2011-06-13 VITALS — BP 130/72 | HR 57 | Ht 71.0 in | Wt 194.4 lb

## 2011-06-13 DIAGNOSIS — I2510 Atherosclerotic heart disease of native coronary artery without angina pectoris: Secondary | ICD-10-CM

## 2011-06-13 DIAGNOSIS — R1013 Epigastric pain: Secondary | ICD-10-CM

## 2011-06-13 DIAGNOSIS — E785 Hyperlipidemia, unspecified: Secondary | ICD-10-CM

## 2011-06-13 NOTE — Assessment & Plan Note (Signed)
His symptoms are atypical for cardiac discomfort but recent GI evaluation has been unremarkable. He does have a history of nonobstructive coronary disease 3 years ago. I think it would be reasonable to reassess his cardiac risk with a stress echo and we will schedule him for this. Otherwise we'll continue with his risk factor modification.

## 2011-06-13 NOTE — Progress Notes (Signed)
Joseph Reilly Date of Birth: 21-May-1945 Medical Record #[id number]  History of Present Illness: Joseph Reilly is seen at the request of Dr. Marina Goodell for evaluation of epigastric discomfort. He was previously evaluated by our practice in March of 2010. At that time he had a cardiac catheterization which showed nonobstructive coronary disease. He had atypical chest pain at that time. He also had an echocardiogram at that time which was essentially normal. He is continued to have intermittent symptoms of epigastric and right upper quadrant abdominal discomfort with a fullness sensation. This is sometimes associated with pressure in his chest and a sense of shortness of breath. He was recently evaluated by gastroenterology where endoscopy showed no acute findings. Abdominal ultrasound showed a small polyp versus non-shadowing stone in the neck of the gallbladder but there was no evidence of ductal dilatation or thickening of the gallbladder. [facility] He has no difficulty walking on her treadmill up to 4.1 miles per hour. He has been on Lipitor for hypercholesterolemia with excellent control.  Current Outpatient Prescriptions on File Prior to Visit  Medication Sig Dispense Refill  . amitriptyline (ELAVIL) 25 MG tablet Take 25 mg by mouth. 1/4 tablet by mouth daily      . aspirin 81 MG tablet Take 81 mg by mouth daily.      Marland Kitchen atorvastatin (LIPITOR) 40 MG tablet Take 40 mg by mouth daily.      . Cholecalciferol (VITAMIN D3) 1000 UNITS CAPS Take 1 capsule by mouth 2 (two) times daily.      . Coenzyme Q10 200 MG capsule Take 200 mg by mouth daily.      . cromolyn (NASALCROM) 5.2 MG/ACT nasal spray Place 1 spray into the nose as needed.      . Glucosamine Sulfate 1000 MG CAPS Take 1 capsule by mouth 2 (two) times daily.      Marland Kitchen losartan (COZAAR) 50 MG tablet One tablet by mouth once daily      . Melatonin 3 MG CAPS Take by mouth. 1/4  Tablet by mouth once daily      .  OMEGA 3 1000 MG CAPS Take 1 capsule by mouth 2 (two) times daily.      Marland Kitchen omeprazole (PRILOSEC OTC) 20 MG tablet Take 20 mg by mouth daily.        Allergies  Allergen Reactions  . Lisinopril Cough    Past Medical History  Diagnosis Date  . GERD (gastroesophageal reflux disease)   . Hemorrhoids   . IBS (irritable bowel syndrome)   . Adenomatous polyp of colon   . Hyperlipemia   . Hiatal hernia     small   . Varicose veins   . Arthritis   . Hypertension   . History of gallstones   . CAD (coronary artery disease)     Past Surgical History  Procedure Date  . Tonsillectomy   . Lumbar spine surgery   . Hydrocele excision / repair   . Cardiac catheterization 2011  . Cardiac catheterization 07/14/2008    EF 60%  . Transthoracic echocardiogram 07/14/2008    EF 60%    History  Smoking status  . Never Smoker   Smokeless tobacco  . Never Used    History  Alcohol Use No    Family History  Problem Relation Age of Onset  . Heart disease Mother   . Diabetes Father 76  . Colon cancer Neg Hx  Review of Systems: As noted in history of present illness.  All other systems were reviewed and are negative.  Physical Exam: BP 130/72  Pulse 57  Ht 5\' 11"  (1.803 m)  Wt 194 lb 6.4 oz (88.179 kg)  BMI 27.11 kg/m2 The patient is alert and oriented x 3.  The mood and affect are normal.  The skin is warm and dry.  Color is normal.  The HEENT exam reveals that the sclera are nonicteric.  The mucous membranes are moist.  The carotids are 2+ without bruits.  There is no thyromegaly.  There is no JVD.  The lungs are clear.  The chest wall is non tender.  The heart exam reveals a regular rate with a normal S1 and S2.  There are no murmurs, gallops, or rubs.  The PMI is not displaced.   Abdominal exam reveals good bowel sounds.  There is no guarding or rebound.  There is no hepatosplenomegaly or tenderness.  There are no masses.  Exam of the legs reveal no clubbing, cyanosis, or edema.  The  legs are without rashes.  The distal pulses are intact.  Cranial nerves II - XII are intact.  Motor and sensory functions are intact.  The gait is normal.  LABORATORY DATA: ECG demonstrates sinus bradycardia with a rate of 57 beats per minute. It is otherwise normal.  Assessment / Plan:

## 2011-06-13 NOTE — Patient Instructions (Signed)
We will schedule you for a stress Echo.   

## 2011-06-22 ENCOUNTER — Ambulatory Visit (HOSPITAL_COMMUNITY): Payer: PRIVATE HEALTH INSURANCE | Attending: Cardiology

## 2011-06-22 ENCOUNTER — Ambulatory Visit (HOSPITAL_BASED_OUTPATIENT_CLINIC_OR_DEPARTMENT_OTHER): Payer: PRIVATE HEALTH INSURANCE

## 2011-06-22 DIAGNOSIS — E785 Hyperlipidemia, unspecified: Secondary | ICD-10-CM | POA: Insufficient documentation

## 2011-06-22 DIAGNOSIS — R5381 Other malaise: Secondary | ICD-10-CM | POA: Insufficient documentation

## 2011-06-22 DIAGNOSIS — I2510 Atherosclerotic heart disease of native coronary artery without angina pectoris: Secondary | ICD-10-CM

## 2011-06-22 DIAGNOSIS — I10 Essential (primary) hypertension: Secondary | ICD-10-CM | POA: Insufficient documentation

## 2011-06-22 DIAGNOSIS — R0989 Other specified symptoms and signs involving the circulatory and respiratory systems: Secondary | ICD-10-CM | POA: Insufficient documentation

## 2011-06-22 DIAGNOSIS — R0609 Other forms of dyspnea: Secondary | ICD-10-CM | POA: Insufficient documentation

## 2011-06-26 ENCOUNTER — Telehealth: Payer: Self-pay | Admitting: Cardiology

## 2011-07-04 NOTE — Telephone Encounter (Signed)
Opened in error

## 2011-11-19 ENCOUNTER — Emergency Department (HOSPITAL_COMMUNITY)
Admission: EM | Admit: 2011-11-19 | Discharge: 2011-11-20 | Disposition: A | Discharge status: Home / Self Care | Payer: PRIVATE HEALTH INSURANCE | Attending: Emergency Medicine | Admitting: Emergency Medicine

## 2011-11-19 DIAGNOSIS — K219 Gastro-esophageal reflux disease without esophagitis: Secondary | ICD-10-CM | POA: Insufficient documentation

## 2011-11-19 DIAGNOSIS — I10 Essential (primary) hypertension: Secondary | ICD-10-CM | POA: Insufficient documentation

## 2011-11-19 DIAGNOSIS — S90569A Insect bite (nonvenomous), unspecified ankle, initial encounter: Secondary | ICD-10-CM | POA: Insufficient documentation

## 2011-11-19 DIAGNOSIS — W57XXXA Bitten or stung by nonvenomous insect and other nonvenomous arthropods, initial encounter: Secondary | ICD-10-CM | POA: Insufficient documentation

## 2011-11-20 LAB — DIFFERENTIAL
Basophils Absolute: 0 10*3/uL (ref 0.0–0.1)
Basophils Relative: 0 % (ref 0–1)
Eosinophils Absolute: 0.2 10*3/uL (ref 0.0–0.7)
Eosinophils Relative: 2 % (ref 0–5)
Lymphocytes Relative: 10 % — ABNORMAL LOW (ref 12–46)
Lymphs Abs: 0.8 10*3/uL (ref 0.7–4.0)
Monocytes Absolute: 0.4 10*3/uL (ref 0.1–1.0)
Monocytes Relative: 4 % (ref 3–12)
Neutro Abs: 6.9 10*3/uL (ref 1.7–7.7)
Neutrophils Relative %: 84 % — ABNORMAL HIGH (ref 43–77)

## 2011-11-20 LAB — BASIC METABOLIC PANEL
BUN: 15 mg/dL (ref 6–23)
CO2: 22 mEq/L (ref 19–32)
Calcium: 8.3 mg/dL — ABNORMAL LOW (ref 8.4–10.5)
Chloride: 104 mEq/L (ref 96–112)
Creatinine, Ser: 0.71 mg/dL (ref 0.50–1.35)
GFR calc Af Amer: >90 mL/min (ref >90)
GFR calc non Af Amer: >90 mL/min (ref >90)
Glucose, Bld: 91 mg/dL (ref 70–99)
Potassium: 3.6 mEq/L (ref 3.5–5.1)
Sodium: 137 mEq/L (ref 135–145)

## 2011-11-20 LAB — CBC
HCT: 42.8 % (ref 39.0–52.0)
Hemoglobin: 15.1 g/dL (ref 13.0–17.0)
MCH: 33.3 pg (ref 26.0–34.0)
MCHC: 35.3 g/dL (ref 30.0–36.0)
MCV: 94.5 fL (ref 78.0–100.0)
Platelets: 124 10*3/uL — ABNORMAL LOW (ref 150–400)
RBC: 4.53 MIL/uL (ref 4.22–5.81)
RDW: 12.9 % (ref 11.5–15.5)
WBC: 8.2 10*3/uL (ref 4.0–10.5)

## 2011-12-08 ENCOUNTER — Encounter: Payer: Self-pay | Admitting: Internal Medicine

## 2012-01-31 DIAGNOSIS — E559 Vitamin D deficiency, unspecified: Secondary | ICD-10-CM | POA: Diagnosis not present

## 2012-01-31 DIAGNOSIS — G4700 Insomnia, unspecified: Secondary | ICD-10-CM | POA: Diagnosis not present

## 2012-01-31 DIAGNOSIS — I10 Essential (primary) hypertension: Secondary | ICD-10-CM | POA: Diagnosis not present

## 2012-01-31 DIAGNOSIS — Z0000 Encounter for general adult medical examination without abnormal findings: Secondary | ICD-10-CM | POA: Diagnosis not present

## 2012-01-31 DIAGNOSIS — Z125 Encounter for screening for malignant neoplasm of prostate: Secondary | ICD-10-CM | POA: Diagnosis not present

## 2012-01-31 DIAGNOSIS — E782 Mixed hyperlipidemia: Secondary | ICD-10-CM | POA: Diagnosis not present

## 2012-01-31 DIAGNOSIS — Z23 Encounter for immunization: Secondary | ICD-10-CM | POA: Diagnosis not present

## 2012-04-04 DIAGNOSIS — R6889 Other general symptoms and signs: Secondary | ICD-10-CM | POA: Diagnosis not present

## 2012-05-12 DIAGNOSIS — J019 Acute sinusitis, unspecified: Secondary | ICD-10-CM | POA: Diagnosis not present

## 2012-05-16 DIAGNOSIS — H524 Presbyopia: Secondary | ICD-10-CM | POA: Diagnosis not present

## 2012-05-16 DIAGNOSIS — H2510 Age-related nuclear cataract, unspecified eye: Secondary | ICD-10-CM | POA: Diagnosis not present

## 2012-05-16 DIAGNOSIS — H5310 Unspecified subjective visual disturbances: Secondary | ICD-10-CM | POA: Diagnosis not present

## 2012-05-16 DIAGNOSIS — H5200 Hypermetropia, unspecified eye: Secondary | ICD-10-CM | POA: Diagnosis not present

## 2012-08-08 ENCOUNTER — Encounter: Payer: Self-pay | Admitting: Internal Medicine

## 2013-02-03 DIAGNOSIS — G4700 Insomnia, unspecified: Secondary | ICD-10-CM | POA: Diagnosis not present

## 2013-02-03 DIAGNOSIS — Z23 Encounter for immunization: Secondary | ICD-10-CM | POA: Diagnosis not present

## 2013-02-03 DIAGNOSIS — R6889 Other general symptoms and signs: Secondary | ICD-10-CM | POA: Diagnosis not present

## 2013-02-03 DIAGNOSIS — Z0000 Encounter for general adult medical examination without abnormal findings: Secondary | ICD-10-CM | POA: Diagnosis not present

## 2013-02-03 DIAGNOSIS — E782 Mixed hyperlipidemia: Secondary | ICD-10-CM | POA: Diagnosis not present

## 2013-02-03 DIAGNOSIS — I10 Essential (primary) hypertension: Secondary | ICD-10-CM | POA: Diagnosis not present

## 2013-02-03 DIAGNOSIS — E559 Vitamin D deficiency, unspecified: Secondary | ICD-10-CM | POA: Diagnosis not present

## 2013-02-03 DIAGNOSIS — Z125 Encounter for screening for malignant neoplasm of prostate: Secondary | ICD-10-CM | POA: Diagnosis not present

## 2013-02-24 DIAGNOSIS — Z1211 Encounter for screening for malignant neoplasm of colon: Secondary | ICD-10-CM | POA: Diagnosis not present

## 2013-02-24 DIAGNOSIS — R109 Unspecified abdominal pain: Secondary | ICD-10-CM | POA: Diagnosis not present

## 2013-02-24 DIAGNOSIS — R932 Abnormal findings on diagnostic imaging of liver and biliary tract: Secondary | ICD-10-CM | POA: Diagnosis not present

## 2013-02-24 DIAGNOSIS — Z86010 Personal history of colonic polyps: Secondary | ICD-10-CM | POA: Diagnosis not present

## 2013-03-03 DIAGNOSIS — N402 Nodular prostate without lower urinary tract symptoms: Secondary | ICD-10-CM | POA: Diagnosis not present

## 2013-03-03 DIAGNOSIS — N489 Disorder of penis, unspecified: Secondary | ICD-10-CM | POA: Diagnosis not present

## 2013-03-04 DIAGNOSIS — Z09 Encounter for follow-up examination after completed treatment for conditions other than malignant neoplasm: Secondary | ICD-10-CM | POA: Diagnosis not present

## 2013-03-04 DIAGNOSIS — D126 Benign neoplasm of colon, unspecified: Secondary | ICD-10-CM | POA: Diagnosis not present

## 2013-03-04 DIAGNOSIS — Z86010 Personal history of colonic polyps: Secondary | ICD-10-CM | POA: Diagnosis not present

## 2013-05-19 DIAGNOSIS — H2510 Age-related nuclear cataract, unspecified eye: Secondary | ICD-10-CM | POA: Diagnosis not present

## 2013-05-19 DIAGNOSIS — H5200 Hypermetropia, unspecified eye: Secondary | ICD-10-CM | POA: Diagnosis not present

## 2013-08-06 DIAGNOSIS — D1801 Hemangioma of skin and subcutaneous tissue: Secondary | ICD-10-CM | POA: Diagnosis not present

## 2013-08-06 DIAGNOSIS — B079 Viral wart, unspecified: Secondary | ICD-10-CM | POA: Diagnosis not present

## 2013-08-06 DIAGNOSIS — L821 Other seborrheic keratosis: Secondary | ICD-10-CM | POA: Diagnosis not present

## 2013-09-19 DIAGNOSIS — N402 Nodular prostate without lower urinary tract symptoms: Secondary | ICD-10-CM | POA: Diagnosis not present

## 2013-09-26 DIAGNOSIS — N489 Disorder of penis, unspecified: Secondary | ICD-10-CM | POA: Diagnosis not present

## 2014-02-06 DIAGNOSIS — E559 Vitamin D deficiency, unspecified: Secondary | ICD-10-CM | POA: Diagnosis not present

## 2014-02-06 DIAGNOSIS — E782 Mixed hyperlipidemia: Secondary | ICD-10-CM | POA: Diagnosis not present

## 2014-02-06 DIAGNOSIS — Z23 Encounter for immunization: Secondary | ICD-10-CM | POA: Diagnosis not present

## 2014-02-06 DIAGNOSIS — I10 Essential (primary) hypertension: Secondary | ICD-10-CM | POA: Diagnosis not present

## 2014-02-06 DIAGNOSIS — G4700 Insomnia, unspecified: Secondary | ICD-10-CM | POA: Diagnosis not present

## 2014-02-06 DIAGNOSIS — Z0000 Encounter for general adult medical examination without abnormal findings: Secondary | ICD-10-CM | POA: Diagnosis not present

## 2014-03-10 ENCOUNTER — Encounter: Payer: Self-pay | Admitting: Cardiology

## 2014-04-07 DIAGNOSIS — M542 Cervicalgia: Secondary | ICD-10-CM | POA: Diagnosis not present

## 2014-04-07 DIAGNOSIS — M5416 Radiculopathy, lumbar region: Secondary | ICD-10-CM | POA: Diagnosis not present

## 2014-04-07 DIAGNOSIS — M545 Low back pain: Secondary | ICD-10-CM | POA: Diagnosis not present

## 2014-05-12 DIAGNOSIS — R1013 Epigastric pain: Secondary | ICD-10-CM | POA: Diagnosis not present

## 2014-05-12 DIAGNOSIS — I8393 Asymptomatic varicose veins of bilateral lower extremities: Secondary | ICD-10-CM | POA: Diagnosis not present

## 2014-05-15 ENCOUNTER — Other Ambulatory Visit: Payer: Self-pay | Admitting: *Deleted

## 2014-05-15 DIAGNOSIS — I83813 Varicose veins of bilateral lower extremities with pain: Secondary | ICD-10-CM

## 2014-05-18 DIAGNOSIS — J329 Chronic sinusitis, unspecified: Secondary | ICD-10-CM | POA: Diagnosis not present

## 2014-06-26 DIAGNOSIS — H2513 Age-related nuclear cataract, bilateral: Secondary | ICD-10-CM | POA: Diagnosis not present

## 2014-06-26 DIAGNOSIS — H5203 Hypermetropia, bilateral: Secondary | ICD-10-CM | POA: Diagnosis not present

## 2014-07-01 ENCOUNTER — Encounter: Payer: Self-pay | Admitting: Vascular Surgery

## 2014-07-02 ENCOUNTER — Encounter: Payer: Self-pay | Admitting: Vascular Surgery

## 2014-07-02 ENCOUNTER — Ambulatory Visit (INDEPENDENT_AMBULATORY_CARE_PROVIDER_SITE_OTHER): Payer: Medicare Other | Admitting: Vascular Surgery

## 2014-07-02 ENCOUNTER — Ambulatory Visit (HOSPITAL_COMMUNITY)
Admission: RE | Admit: 2014-07-02 | Discharge: 2014-07-02 | Disposition: A | Discharge status: Home / Self Care | Payer: Medicare Other | Source: Ambulatory Visit | Attending: Vascular Surgery | Admitting: Vascular Surgery

## 2014-07-02 VITALS — BP 134/80 | HR 65 | Resp 16 | Ht 71.0 in | Wt 199.0 lb

## 2014-07-02 DIAGNOSIS — I83813 Varicose veins of bilateral lower extremities with pain: Secondary | ICD-10-CM | POA: Diagnosis not present

## 2014-07-02 NOTE — Progress Notes (Signed)
[facility] [place] HISTORY AND PHYSICAL   History of Present Illness:  Patient is a 69 y.o. year old male who presents for evaluation of bilateral lower extremity varicose veins with pain. The patient has a stinging and burning sensation in his right inner thigh. This is been present for several years but slowly getting worse. He also developed a fullness in achy sensation in his lower extremities bilaterally. This will improve in the evening after leg elevation and resting. He does have some chronic numbness and tingling in his right foot from prior back operations. He hasn't worn compression stockings in the past for several years and he thinks these help a little bit but do not completely alleviate his symptoms. He states that his varicose veins have been growing over several years. He formally worked as a Surveyor, mining at [employer]. He has been standing on his legs for years during that job. He denies tobacco use. He has no family history of varicose veins. He does have a family history in his father of a thoracic aortic aneurysm.  Other medical problems include reflux, irritable bowel syndrome, hyperlipidemia, arthritis, hypertension, coronary artery disease all of which are currently stable.  Past Medical History  Diagnosis Date  . GERD (gastroesophageal reflux disease)   . Hemorrhoids   . IBS (irritable bowel syndrome)   . Adenomatous polyp of colon   . Hyperlipemia   . Hiatal hernia     small   . Varicose veins   . Arthritis   . Hypertension   . History of gallstones   . CAD (coronary artery disease)     Past Surgical History  Procedure Laterality Date  . Tonsillectomy    . Lumbar spine surgery    . Hydrocele excision / repair    . Cardiac catheterization  2011  . Cardiac catheterization  07/14/2008    EF 60%  . Transthoracic echocardiogram  07/14/2008    EF 60%    Social History History  Substance Use Topics  . Smoking status: Never  Smoker   . Smokeless tobacco: Never Used  . Alcohol Use: No    Family History Family History  Problem Relation Age of Onset  . Heart disease Mother   . Diabetes Father 55  . Colon cancer Neg Hx     Allergies  Allergies  Allergen Reactions  . Lisinopril Cough  . Lotensin [Benazepril Hcl]     "Allergic"     Current Outpatient Prescriptions  Medication Sig Dispense Refill  . amitriptyline (ELAVIL) 25 MG tablet Take 25 mg by mouth. 1/4 tablet by mouth daily    . aspirin 81 MG tablet Take 81 mg by mouth daily.    Marland Kitchen atorvastatin (LIPITOR) 40 MG tablet Take 40 mg by mouth daily.    . Cholecalciferol (VITAMIN D3) 1000 UNITS CAPS Take 1 capsule by mouth 2 (two) times daily.    . Coenzyme Q10 200 MG capsule Take 200 mg by mouth daily.    . cromolyn (NASALCROM) 5.2 MG/ACT nasal spray Place 1 spray into the nose as needed.    . Glucosamine Sulfate 1000 MG CAPS Take 1 capsule by mouth 2 (two) times daily.    Marland Kitchen losartan (COZAAR) 50 MG tablet One tablet by mouth once daily    . Melatonin 3 MG CAPS Take by mouth. 1/4  Tablet by mouth once daily    . Multiple Vitamin (MULTI-VITAMIN PO) Take by mouth daily.    . OMEGA 3  1000 MG CAPS Take 1 capsule by mouth 2 (two) times daily.    Marland Kitchen omeprazole (PRILOSEC OTC) 20 MG tablet Take 20 mg by mouth daily.     No current facility-administered medications for this visit.    ROS:   General:  No weight loss, Fever, chills  HEENT: No recent headaches, no nasal bleeding, no visual changes, no sore throat  Neurologic: No dizziness, blackouts, seizures. No recent symptoms of stroke or mini- stroke. No recent episodes of slurred speech, or temporary blindness.  Cardiac: No recent episodes of chest pain/pressure, no shortness of breath at rest.  No shortness of breath with exertion.  Denies history of atrial fibrillation or irregular heartbeat  Vascular: No history of rest pain in feet.  No history of claudication.  No history of non-healing ulcer, No  history of DVT   Pulmonary: No home oxygen, no productive cough, no hemoptysis,  No asthma or wheezing  Musculoskeletal:  [ ]  Arthritis, [ ]  Low back pain,  [ ]  Joint pain  Hematologic:No history of hypercoagulable state.  No history of easy bleeding.  No history of anemia  Gastrointestinal: No hematochezia or melena,  No gastroesophageal reflux, no trouble swallowing  Urinary: [ ]  chronic Kidney disease, [ ]  on HD - [ ]  MWF or [ ]  TTHS, [ ]  Burning with urination, [ ]  Frequent urination, [ ]  Difficulty urinating;   Skin: No rashes  Psychological: No history of anxiety,  No history of depression   Physical Examination  Filed Vitals:   07/02/14 1434  BP: 134/80  Pulse: 65  Resp: 16  Height: 5\' 11"  (1.803 m)  Weight: 199 lb (90.266 kg)    Body mass index is 27.77 kg/(m^2).  General:  Alert and oriented, no acute distress HEENT: Normal Neck: No bruit or JVD Pulmonary: Clear to auscultation bilaterally Cardiac: Regular Rate and Rhythm without murmur Abdomen: Soft, non-tender, non-distended, no mass Skin: No rash, hemosiderin staining right gaiter area, large clusters of reticular veins on the inner aspect of each foot, large varicosities over the greater saphenous coarse bilateral lower extremities 7-8 mm diameter. Several large varicosities across the knee and calf bilaterally. These are all of similar size. Extremity Pulses:  2+ radial, brachial, femoral, dorsalis pedis, posterior tibial pulses bilaterally Musculoskeletal: No deformity trace edema  Neurologic: Upper and lower extremity motor 5/5 and symmetric  DATA:  Venous reflux exam was performed today. I reviewed and interpreted this study. This showed evidence of deep reflux bilaterally. He also had superficial reflux bilaterally. The right greater saphenous vein diameter was 5-12 mm. Left was 7 and 9 mm.   ASSESSMENT:  #1 bilateral symptomatic varicose veins bilateral lower extremities. The patient was given a  prescription today for thigh-high compression stockings 20-30 mmHg to see if we can improve his symptoms further. He will follow-up with Dr. early or Dr. Kellie Simmering in 3 months time to consider laser ablation of his greater saphenous vein.  #2 patient with family history of thoracic aneurysm. I discussed with the patient today that he could get Dr. Harrington Challenger to do a abdominal aortic aneurysm screening through Medicare especially with his history of aneurysm in his family. He will discuss this with Dr. Harrington Challenger.   PLAN:  See above  Ruta Hinds, MD [facility] Office: [phone] Pager: [phone]

## 2014-07-07 DIAGNOSIS — Z6828 Body mass index (BMI) 28.0-28.9, adult: Secondary | ICD-10-CM | POA: Diagnosis not present

## 2014-07-07 DIAGNOSIS — M5416 Radiculopathy, lumbar region: Secondary | ICD-10-CM | POA: Diagnosis not present

## 2014-07-31 DIAGNOSIS — Z79899 Other long term (current) drug therapy: Secondary | ICD-10-CM | POA: Diagnosis not present

## 2014-07-31 DIAGNOSIS — G4700 Insomnia, unspecified: Secondary | ICD-10-CM | POA: Diagnosis not present

## 2014-07-31 DIAGNOSIS — E782 Mixed hyperlipidemia: Secondary | ICD-10-CM | POA: Diagnosis not present

## 2014-08-07 DIAGNOSIS — E782 Mixed hyperlipidemia: Secondary | ICD-10-CM | POA: Diagnosis not present

## 2014-08-07 DIAGNOSIS — M79601 Pain in right arm: Secondary | ICD-10-CM | POA: Diagnosis not present

## 2014-08-07 DIAGNOSIS — M1990 Unspecified osteoarthritis, unspecified site: Secondary | ICD-10-CM | POA: Diagnosis not present

## 2014-08-12 DIAGNOSIS — M65312 Trigger thumb, left thumb: Secondary | ICD-10-CM | POA: Diagnosis not present

## 2014-09-15 DIAGNOSIS — M79644 Pain in right finger(s): Secondary | ICD-10-CM | POA: Diagnosis not present

## 2014-09-25 DIAGNOSIS — N402 Nodular prostate without lower urinary tract symptoms: Secondary | ICD-10-CM | POA: Diagnosis not present

## 2014-09-25 DIAGNOSIS — N4889 Other specified disorders of penis: Secondary | ICD-10-CM | POA: Diagnosis not present

## 2014-10-02 DIAGNOSIS — N402 Nodular prostate without lower urinary tract symptoms: Secondary | ICD-10-CM | POA: Diagnosis not present

## 2014-10-02 DIAGNOSIS — N4889 Other specified disorders of penis: Secondary | ICD-10-CM | POA: Diagnosis not present

## 2014-10-05 ENCOUNTER — Encounter: Payer: Self-pay | Admitting: Vascular Surgery

## 2014-10-06 ENCOUNTER — Ambulatory Visit (INDEPENDENT_AMBULATORY_CARE_PROVIDER_SITE_OTHER): Payer: Medicare Other | Admitting: Vascular Surgery

## 2014-10-06 ENCOUNTER — Encounter: Payer: Self-pay | Admitting: Vascular Surgery

## 2014-10-06 VITALS — BP 127/80 | HR 67 | Resp 18 | Ht 70.5 in | Wt 196.9 lb

## 2014-10-06 DIAGNOSIS — I83813 Varicose veins of bilateral lower extremities with pain: Secondary | ICD-10-CM | POA: Diagnosis not present

## 2014-10-06 DIAGNOSIS — I83819 Varicose veins of unspecified lower extremities with pain: Secondary | ICD-10-CM | POA: Insufficient documentation

## 2014-10-06 NOTE — Progress Notes (Signed)
Problems with Activities of Daily Living Secondary to Leg Pain  1. Mr. Joseph Reilly states that he has had to alter (decrease frequency and types of activities) exercise regimen due to leg pain.   2. Mr. Joseph Reilly states he has difficulty climbing stairs due to leg pain.   3. Mr. Joseph Reilly states he has difficulty with prolonged standing due to leg pain.    Failure of  Conservative Therapy:  1. Worn 20-30 mm Hg thigh high compression hose >3 months with no relief of symptoms.  2. Frequently elevates legs-no relief of symptoms  3. Taken Ibuprofen 600 Mg TID with no relief of symptoms.   patient seems to have significant discomfort despite surgical treatment. I reviewed the bilateral venous duplex that Dr. Oneida Alar and obtained in March 2016. Discussed this again with the patient and his wife present. This does show reflux in his great saphenous vein bilaterally with extension directly into the tributary varicosities.   Fillet he has failed conservative treatment of recommended staged bilateral laser ablation of his great saphenous veins. Thing that he may require a staged  Phlebectomy of his reticular varicosities if he continues to have symptoms related to these. He understands and wished to proceed as soon as possible. Right leg is somewhat more symptomatic and so we will proceed with this treatment first.

## 2014-10-08 ENCOUNTER — Other Ambulatory Visit: Payer: Self-pay | Admitting: *Deleted

## 2014-10-08 DIAGNOSIS — I83813 Varicose veins of bilateral lower extremities with pain: Secondary | ICD-10-CM

## 2014-10-20 ENCOUNTER — Encounter: Payer: Self-pay | Admitting: Vascular Surgery

## 2014-10-22 ENCOUNTER — Encounter: Payer: Self-pay | Admitting: Vascular Surgery

## 2014-10-22 ENCOUNTER — Ambulatory Visit (INDEPENDENT_AMBULATORY_CARE_PROVIDER_SITE_OTHER): Payer: Medicare Other | Admitting: Vascular Surgery

## 2014-10-22 VITALS — BP 125/81 | HR 70 | Resp 18 | Ht 70.5 in | Wt 196.0 lb

## 2014-10-22 DIAGNOSIS — I83813 Varicose veins of bilateral lower extremities with pain: Secondary | ICD-10-CM | POA: Diagnosis not present

## 2014-10-22 HISTORY — PX: ENDOVENOUS ABLATION SAPHENOUS VEIN W/ LASER: SUR449

## 2014-10-22 NOTE — Progress Notes (Signed)
     Laser Ablation Procedure    Date: 10/22/2014   Joseph Reilly DOB:02-07-1946  Consent signed: Yes    Surgeon:  Dr. Sherren Mocha Brecken Walth  Procedure: Laser Ablation: right Greater Saphenous Vein  BP 125/81 mmHg  Pulse 70  Resp 18  Ht 5' 10.5" (1.791 m)  Wt 196 lb (88.905 kg)  BMI 27.72 kg/m2  Tumescent Anesthesia: 700 cc 0.9% NaCl with 50 cc Lidocaine HCL with 1% Epi and 15 cc 8.4% NaHCO3  Local Anesthesia: 3 cc Lidocaine HCL and NaHCO3 (ratio 2:1)  15 watts continuous mode        Total energy: 3662 Joules   Total time: 4:03      Patient tolerated procedure well    Description of Procedure:  After marking the course of the secondary varicosities, the patient was placed on the operating table in the supine position, and the right leg was prepped and draped in sterile fashion.   Local anesthetic was administered and under ultrasound guidance the saphenous vein was accessed with a micro needle and guide wire; then the mirco puncture sheath was placed.  A guide wire was inserted saphenofemoral junction , followed by a 5 french sheath.  The position of the sheath and then the laser fiber below the junction was confirmed using the ultrasound.  Tumescent anesthesia was administered along the course of the saphenous vein using ultrasound guidance. The patient was placed in Trendelenburg position and protective laser glasses were placed on patient and staff, and the laser was fired at 15 watts continuous mode advancing 1-63mm/second for a total of 3662 joules.      Steri strips were applied and ABD pads and thigh high compression stockings were applied.  Ace wrap bandages were applied and at the top of the saphenofemoral junction. Blood loss was less than 15 cc.  The patient ambulated out of the operating room having tolerated the procedure well.  Uneventful ablation from mid to distal calf to the saphenofemoral junction. He had duplicated saphenous system with the normal location in the  proximal third of the thigh and very superficial large refluxing vein throughout the day the medial thigh and medial calf. Had the ablation of this area. We'll see him in one week

## 2014-10-23 ENCOUNTER — Telehealth: Payer: Self-pay | Admitting: *Deleted

## 2014-10-23 ENCOUNTER — Encounter: Payer: Self-pay | Admitting: Vascular Surgery

## 2014-10-23 NOTE — Telephone Encounter (Signed)
    10/23/2014  Time: 9:19 AM   Patient Name: Chrishaun Sasso Chmiel  Patient of: T.F. Early  Procedure:Laser Ablation right greater saphenous vein 10-22-2014  Reached patient at home and checked  His status  Yes    Comments/Actions Taken: Mr. Savage states he is doing well overall but has mild, burning sensation along right inner thigh (area treated).  He has just eaten breakfast and will take Ibuprofen as directed and apply ice compress.  Denies right leg swelling.  Reviewed post procedural instructions with Mr. Dasaro and reminded him of post laser ablation duplex and VV follow up appointment with Dr. Donnetta Hutching on 10-29-2014.      @SIGNATURE @

## 2014-10-27 ENCOUNTER — Encounter (HOSPITAL_COMMUNITY): Payer: Medicare Other

## 2014-10-27 ENCOUNTER — Ambulatory Visit: Payer: Medicare Other | Admitting: Vascular Surgery

## 2014-10-28 ENCOUNTER — Encounter: Payer: Self-pay | Admitting: Vascular Surgery

## 2014-10-29 ENCOUNTER — Ambulatory Visit (INDEPENDENT_AMBULATORY_CARE_PROVIDER_SITE_OTHER): Payer: Medicare Other | Admitting: Vascular Surgery

## 2014-10-29 ENCOUNTER — Encounter: Payer: Self-pay | Admitting: Vascular Surgery

## 2014-10-29 ENCOUNTER — Ambulatory Visit (HOSPITAL_COMMUNITY)
Admission: RE | Admit: 2014-10-29 | Discharge: 2014-10-29 | Disposition: A | Discharge status: Home / Self Care | Payer: Medicare Other | Source: Ambulatory Visit | Attending: Vascular Surgery | Admitting: Vascular Surgery

## 2014-10-29 VITALS — BP 134/86 | HR 62 | Resp 18 | Ht 70.5 in | Wt 197.0 lb

## 2014-10-29 DIAGNOSIS — I83813 Varicose veins of bilateral lower extremities with pain: Secondary | ICD-10-CM | POA: Diagnosis not present

## 2014-10-29 NOTE — Progress Notes (Signed)
Presents today for one-week follow-up of ablation of his great saphenous vein from mid calf to groin. He had a very large vein was very superficial location. He reports minimal discomfort following the procedure and has minimal discomfort currently  On physical exam he does have an easily palpable cord throughout this large thrombosed saphenous vein from the insertion site in the mid calf up to his proximal thigh. No overlying skin irritation  Duplex today was reviewed with the patient. This shows closure of the saphenous vein throughout the course. No evidence of DVT  Impression and plan good post follow-up of his laser ablation. Is scheduled for similar treatment in his left leg on 11/19/2014. Will wear his compression for one additional week and then as needed

## 2014-11-05 DIAGNOSIS — M1812 Unilateral primary osteoarthritis of first carpometacarpal joint, left hand: Secondary | ICD-10-CM | POA: Diagnosis not present

## 2014-11-05 DIAGNOSIS — M65312 Trigger thumb, left thumb: Secondary | ICD-10-CM | POA: Diagnosis not present

## 2014-11-16 ENCOUNTER — Encounter: Payer: Self-pay | Admitting: Vascular Surgery

## 2014-11-19 ENCOUNTER — Ambulatory Visit (INDEPENDENT_AMBULATORY_CARE_PROVIDER_SITE_OTHER): Payer: Medicare Other | Admitting: Vascular Surgery

## 2014-11-19 ENCOUNTER — Encounter: Payer: Self-pay | Admitting: Vascular Surgery

## 2014-11-19 VITALS — BP 139/93 | HR 76 | Resp 18 | Ht 70.5 in | Wt 197.0 lb

## 2014-11-19 DIAGNOSIS — I83813 Varicose veins of bilateral lower extremities with pain: Secondary | ICD-10-CM | POA: Diagnosis not present

## 2014-11-19 HISTORY — PX: ENDOVENOUS ABLATION SAPHENOUS VEIN W/ LASER: SUR449

## 2014-11-19 NOTE — Progress Notes (Signed)
     Laser Ablation Procedure    Date: 11/19/2014   Joseph Reilly DOB:Dec 30, 1945  Consent signed: Yes    Surgeon:  Dr. Sherren Mocha Faige Seely  Procedure: Laser Ablation: left Greater Saphenous Vein  BP 139/93 mmHg  Pulse 76  Resp 18  Ht 5' 10.5" (1.791 m)  Wt 197 lb (89.359 kg)  BMI 27.86 kg/m2  Tumescent Anesthesia: 475 cc 0.9% NaCl with 50 cc Lidocaine HCL with 1% Epi and 15 cc 8.4% NaHCO3  Local Anesthesia: 2 cc Lidocaine HCL and NaHCO3 (ratio 2:1)  15 watts continuous mode        Total energy: 3,372 Joules   Total time: 3:45      Patient tolerated procedure well    Description of Procedure:  After marking the course of the secondary varicosities, the patient was placed on the operating table in the supine position, and the left leg was prepped and draped in sterile fashion.   Local anesthetic was administered and under ultrasound guidance the saphenous vein was accessed with a micro needle and guide wire; then the mirco puncture sheath was placed.  A guide wire was inserted saphenofemoral junction , followed by a 5 french sheath.  The position of the sheath and then the laser fiber below the junction was confirmed using the ultrasound.  Tumescent anesthesia was administered along the course of the saphenous vein using ultrasound guidance. The patient was placed in Trendelenburg position and protective laser glasses were placed on patient and staff, and the laser was fired at 15 watts continuous mode advancing 1-8mm/second for a total of 3372 joules.    Steri strip was applied and ABD pads and thigh high compression stockings were applied.  Ace wrap bandages were applied  at the top of the saphenofemoral junction. Blood loss was less than 15 cc.  The patient ambulated out of the operating room having tolerated the procedure well.

## 2014-11-19 NOTE — Progress Notes (Signed)
Filed Vitals:   11/19/14 0825 11/19/14 0826  BP: 139/91 139/93  Pulse: 76   Resp: 18   Height: 5' 10.5" (1.791 m)   Weight: 197 lb (89.359 kg)

## 2014-11-20 ENCOUNTER — Telehealth: Payer: Self-pay | Admitting: *Deleted

## 2014-11-20 NOTE — Telephone Encounter (Signed)
    11/20/2014  Time: 9:38 AM   Patient Name: Joseph Reilly  Patient of: T.F. Early  Procedure:Laser Ablation left greater saphenous vein 11-19-2014  Reached patient at home and checked  His status  Yes    Comments/Actions Taken: Mr. Stehlin states he is "doing well" with mild discomfort along left inner thigh (area treated) that responds to Ibuprofen, elevation, and ice compresses.  Reviewed all post procedural instructions with him and reminded him of post laser ablation duplex and VV FU on 12-01-2014.      @SIGNATURE @

## 2014-11-26 ENCOUNTER — Encounter: Payer: Self-pay | Admitting: Vascular Surgery

## 2014-12-01 ENCOUNTER — Ambulatory Visit (HOSPITAL_COMMUNITY)
Admission: RE | Admit: 2014-12-01 | Discharge: 2014-12-01 | Disposition: A | Discharge status: Home / Self Care | Payer: Medicare Other | Source: Ambulatory Visit | Attending: Vascular Surgery | Admitting: Vascular Surgery

## 2014-12-01 ENCOUNTER — Ambulatory Visit (INDEPENDENT_AMBULATORY_CARE_PROVIDER_SITE_OTHER): Payer: Medicare Other | Admitting: Vascular Surgery

## 2014-12-01 ENCOUNTER — Encounter: Payer: Self-pay | Admitting: Vascular Surgery

## 2014-12-01 VITALS — BP 132/82 | HR 72 | Temp 97.8°F | Resp 18 | Ht 70.5 in | Wt 200.3 lb

## 2014-12-01 DIAGNOSIS — I83813 Varicose veins of bilateral lower extremities with pain: Secondary | ICD-10-CM

## 2014-12-01 NOTE — Progress Notes (Signed)
The patient presents today for follow-up of age bilateral great saphenous vein ablation. This most recent procedure was 11/19/2014 with late left great saphenous vein ablation from mid calf to just below the saphenofemoral junction. He has been very compliant with his compression garments. He reports the typical amount of mild discomfort associated with the ablation.  On physical exam he has no bruising. He does have easily palpable thrombosed saphenous vein from the insertion site in the mid calf proximally.   Duplex today shows thrombosis of his saphenous vein from the insertion site to 2 cm from the saphenofemoral junction  Impression and plan successful staged bilateral laser ablation of great saphenous vein. Will resume full activity without limitation. We'll see [place] again on as-needed basis. He does feel a she is of benefit from thigh compression we'll continue the use of this. Also explained that he could attempt knee-high compression to see if he achieved similar benefit with knee-high stockings.

## 2015-02-03 DIAGNOSIS — Z125 Encounter for screening for malignant neoplasm of prostate: Secondary | ICD-10-CM | POA: Diagnosis not present

## 2015-02-03 DIAGNOSIS — I10 Essential (primary) hypertension: Secondary | ICD-10-CM | POA: Diagnosis not present

## 2015-02-03 DIAGNOSIS — E559 Vitamin D deficiency, unspecified: Secondary | ICD-10-CM | POA: Diagnosis not present

## 2015-02-03 DIAGNOSIS — E782 Mixed hyperlipidemia: Secondary | ICD-10-CM | POA: Diagnosis not present

## 2015-02-09 DIAGNOSIS — Z23 Encounter for immunization: Secondary | ICD-10-CM | POA: Diagnosis not present

## 2015-02-09 DIAGNOSIS — E782 Mixed hyperlipidemia: Secondary | ICD-10-CM | POA: Diagnosis not present

## 2015-02-09 DIAGNOSIS — M1990 Unspecified osteoarthritis, unspecified site: Secondary | ICD-10-CM | POA: Diagnosis not present

## 2015-02-09 DIAGNOSIS — I10 Essential (primary) hypertension: Secondary | ICD-10-CM | POA: Diagnosis not present

## 2015-02-09 DIAGNOSIS — G4700 Insomnia, unspecified: Secondary | ICD-10-CM | POA: Diagnosis not present

## 2015-02-09 DIAGNOSIS — Z0000 Encounter for general adult medical examination without abnormal findings: Secondary | ICD-10-CM | POA: Diagnosis not present

## 2015-05-28 ENCOUNTER — Other Ambulatory Visit: Payer: Self-pay | Admitting: Gastroenterology

## 2015-05-28 DIAGNOSIS — R932 Abnormal findings on diagnostic imaging of liver and biliary tract: Secondary | ICD-10-CM | POA: Diagnosis not present

## 2015-05-28 DIAGNOSIS — R109 Unspecified abdominal pain: Secondary | ICD-10-CM | POA: Diagnosis not present

## 2015-06-03 ENCOUNTER — Ambulatory Visit
Admission: RE | Admit: 2015-06-03 | Discharge: 2015-06-03 | Disposition: A | Discharge status: Home / Self Care | Payer: Medicare Other | Source: Ambulatory Visit | Attending: Gastroenterology | Admitting: Gastroenterology

## 2015-06-03 DIAGNOSIS — R109 Unspecified abdominal pain: Secondary | ICD-10-CM

## 2015-06-03 DIAGNOSIS — K824 Cholesterolosis of gallbladder: Secondary | ICD-10-CM | POA: Diagnosis not present

## 2015-07-01 DIAGNOSIS — H2513 Age-related nuclear cataract, bilateral: Secondary | ICD-10-CM | POA: Diagnosis not present

## 2015-07-01 DIAGNOSIS — Z0100 Encounter for examination of eyes and vision without abnormal findings: Secondary | ICD-10-CM | POA: Diagnosis not present

## 2015-08-03 DIAGNOSIS — E782 Mixed hyperlipidemia: Secondary | ICD-10-CM | POA: Diagnosis not present

## 2015-08-03 DIAGNOSIS — Z79899 Other long term (current) drug therapy: Secondary | ICD-10-CM | POA: Diagnosis not present

## 2015-08-10 DIAGNOSIS — E782 Mixed hyperlipidemia: Secondary | ICD-10-CM | POA: Diagnosis not present

## 2015-08-12 ENCOUNTER — Encounter: Payer: Self-pay | Admitting: Internal Medicine

## 2015-09-24 ENCOUNTER — Ambulatory Visit (INDEPENDENT_AMBULATORY_CARE_PROVIDER_SITE_OTHER): Payer: Medicare Other | Admitting: Physician Assistant

## 2015-09-24 VITALS — BP 158/80 | HR 70 | Temp 98.1°F | Resp 16 | Ht 71.0 in | Wt 198.4 lb

## 2015-09-24 DIAGNOSIS — R05 Cough: Secondary | ICD-10-CM

## 2015-09-24 DIAGNOSIS — J019 Acute sinusitis, unspecified: Secondary | ICD-10-CM | POA: Diagnosis not present

## 2015-09-24 DIAGNOSIS — R059 Cough, unspecified: Secondary | ICD-10-CM

## 2015-09-24 MED ORDER — AMOXICILLIN-POT CLAVULANATE 875-125 MG PO TABS: 1.0000 | ORAL_TABLET | Freq: Two times a day (BID) | ORAL | Status: AC

## 2015-09-24 MED ORDER — IPRATROPIUM BROMIDE 0.03 % NA SOLN: 2.0000 | Freq: Two times a day (BID) | NASAL | Status: DC

## 2015-09-24 MED ORDER — BENZONATATE 100 MG PO CAPS: 100.0000 mg | ORAL_CAPSULE | Freq: Three times a day (TID) | ORAL | Status: DC | PRN

## 2015-09-24 NOTE — Progress Notes (Signed)
Patient ID: Joseph Reilly, male     DOB: 07-30-1945, 70 y.o.    MRN: [id number]  PCP:  Melinda Crutch, MD  Chief Complaint  Patient presents with  . Sinusitis    sinus pressure headache/ x sat  . Sore Throat    x sat  . Cough    chest congestion/ x sat  . Ear Pain    ears fill full    Subjective:    HPI  Presents for evaluation of possible sinusitis.  "My wife had it first. She came home two weeks ago with it. I took care of her." "I had a blister on my lip, right in the way." Bit into it while eating. Applied an OTC salve with good results. Several mornings later he awoke with a dry throat. The other symptoms developed gradually since then.  Seemd like he was getting better, about a week ago, then worse again the past several days.  Cough is sometimes productive. Feels liitle photosensitive. Upper tooth on the LEFT feels pressure. No fever, chills, nausea or vomiting.   Prior to Admission medications   Medication Sig Start Date End Date Taking? Authorizing Provider  amitriptyline (ELAVIL) 25 MG tablet Take 25 mg by mouth. 1/4 tablet by mouth daily   Yes Historical Provider, MD  aspirin 81 MG tablet Take 81 mg by mouth daily.   Yes Historical Provider, MD  Cholecalciferol (VITAMIN D3) 1000 UNITS CAPS Take 1 capsule by mouth 2 (two) times daily.   Yes Historical Provider, MD  cholestyramine light (PREVALITE) 4 GM/DOSE powder Take by mouth daily.   Yes Historical Provider, MD  Coenzyme Q10 200 MG capsule Take 200 mg by mouth daily.   Yes Historical Provider, MD  cromolyn (NASALCROM) 5.2 MG/ACT nasal spray Place 1 spray into the nose as needed.   Yes Historical Provider, MD  Glucosamine Sulfate 1000 MG CAPS Take 1 capsule by mouth 2 (two) times daily.   Yes Historical Provider, MD  Javier Docker Oil 300 MG CAPS Take 300 mg by mouth 2 (two) times daily.   Yes Historical Provider, MD  losartan (COZAAR) 50 MG tablet One tablet by mouth once daily 04/18/11  Yes Historical Provider, MD    Melatonin 3 MG CAPS Take by mouth. 1/4  Tablet by mouth once daily   Yes Historical Provider, MD  Multiple Vitamin (MULTI-VITAMIN PO) Take by mouth daily.   Yes Historical Provider, MD  OMEGA 3 1000 MG CAPS Take 1 capsule by mouth 2 (two) times daily.   Yes Historical Provider, MD  omeprazole (PRILOSEC OTC) 20 MG tablet Take 20 mg by mouth daily.   Yes Historical Provider, MD  vitamin B-12 (CYANOCOBALAMIN) 500 MCG tablet Take 500 mcg by mouth daily.   Yes Historical Provider, MD  atorvastatin (LIPITOR) 40 MG tablet Take 40 mg by mouth daily. Reported on 09/24/2015    Historical Provider, MD     Allergies  Allergen Reactions  . Lisinopril Cough  . Lotensin [Benazepril Hcl]     "Allergic"     Patient Active Problem List   Diagnosis Date Noted  . Varicose veins with pain 10/06/2014  . CAD (coronary artery disease) 06/13/2011  . ABDOMINAL PAIN-EPIGASTRIC 11/28/2007  . ADENOMATOUS COLONIC POLYP 11/27/2007  . HEMORRHOIDS 11/27/2007  . GERD 11/27/2007  . IBS 11/27/2007     Family History  Problem Relation Age of Onset  . Heart disease Mother   . Diabetes Father 12  . Colon cancer Neg Hx  Social History   Social History  . Marital Status: Married    Spouse Name: N/A  . Number of Children: 1  . Years of Education: N/A   Occupational History  . Retired     [place] [employer]    Social History Main Topics  . Smoking status: Never Smoker   . Smokeless tobacco: Never Used  . Alcohol Use: No  . Drug Use: No  . Sexual Activity: Not on file   Other Topics Concern  . Not on file   Social History Narrative   Caffeine drink daily         Review of Systems As above.      Objective:  Physical Exam  Constitutional: He is oriented to person, place, and time. He appears well-developed and well-nourished. No distress.  BP 158/80 mmHg  Pulse 70  Temp(Src) 98.1 F (36.7 C) (Oral)  Resp 16  Ht 5\' 11"  (1.803 m)  Wt 198 lb 6.4 oz (89.994 kg)  BMI 27.68 kg/m2  SpO2  97%   HENT:  Head: Normocephalic and atraumatic.  Right Ear: Hearing, tympanic membrane, external ear and ear canal normal.  Left Ear: Hearing, tympanic membrane, external ear and ear canal normal.  Nose: Mucosal edema and rhinorrhea present.  No foreign bodies. Right sinus exhibits maxillary sinus tenderness. Right sinus exhibits no frontal sinus tenderness. Left sinus exhibits maxillary sinus tenderness. Left sinus exhibits no frontal sinus tenderness.  Mouth/Throat: Uvula is midline, oropharynx is clear and moist and mucous membranes are normal. No uvula swelling. No oropharyngeal exudate.  Eyes: Conjunctivae and EOM are normal. Pupils are equal, round, and reactive to light. Right eye exhibits no discharge. Left eye exhibits no discharge. No scleral icterus.  Neck: Trachea normal, normal range of motion and full passive range of motion without pain. Neck supple. No thyroid mass and no thyromegaly present.  Cardiovascular: Normal rate, regular rhythm and normal heart sounds.   Pulmonary/Chest: Effort normal and breath sounds normal.  Lymphadenopathy:       Head (right side): No submandibular, no tonsillar, no preauricular, no posterior auricular and no occipital adenopathy present.       Head (left side): No submandibular, no tonsillar, no preauricular and no occipital adenopathy present.    He has no cervical adenopathy.       Right: No supraclavicular adenopathy present.       Left: No supraclavicular adenopathy present.  Neurological: He is alert and oriented to person, place, and time. He has normal strength. No cranial nerve deficit or sensory deficit.  Skin: Skin is warm, dry and intact. No rash noted.  Psychiatric: He has a normal mood and affect. His speech is normal and behavior is normal.             Assessment & Plan:  1. Cough Due to #2 - benzonatate (TESSALON) 100 MG capsule; Take 1-2 capsules (100-200 mg total) by mouth 3 (three) times daily as needed for cough.   Dispense: 40 capsule; Refill: 0  2. Acute sinusitis, recurrence not specified, unspecified location Supportive care.  Anticipatory guidance.  RTC if symptoms worsen/persist. - amoxicillin-clavulanate (AUGMENTIN) 875-125 MG tablet; Take 1 tablet by mouth 2 (two) times daily.  Dispense: 20 tablet; Refill: 0 - ipratropium (ATROVENT) 0.03 % nasal spray; Place 2 sprays into both nostrils 2 (two) times daily.  Dispense: 30 mL; Refill: 0   Fara Chute, PA-C Physician Assistant-Certified [facility]

## 2015-09-24 NOTE — Patient Instructions (Addendum)
Get plenty of rest and drink at least 64 ounces of water daily.     IF you received an x-ray today, you will receive an invoice from [facility]. Please contact [facility] at [phone] with questions or concerns regarding your invoice.   IF you received labwork today, you will receive an invoice from [facility]. Please contact [facility] at [phone] with questions or concerns regarding your invoice.   Our billing staff will not be able to assist you with questions regarding bills from these companies.  You will be contacted with the lab results as soon as they are available. The fastest way to get your results is to activate your My Chart account. Instructions are located on the last page of this paperwork. If you have not heard from us regarding the results in 2 weeks, please contact this office.     

## 2015-09-29 DIAGNOSIS — N402 Nodular prostate without lower urinary tract symptoms: Secondary | ICD-10-CM | POA: Diagnosis not present

## 2015-10-13 DIAGNOSIS — N400 Enlarged prostate without lower urinary tract symptoms: Secondary | ICD-10-CM | POA: Diagnosis not present

## 2015-10-27 DIAGNOSIS — M1812 Unilateral primary osteoarthritis of first carpometacarpal joint, left hand: Secondary | ICD-10-CM | POA: Diagnosis not present

## 2016-03-07 DIAGNOSIS — Z0000 Encounter for general adult medical examination without abnormal findings: Secondary | ICD-10-CM | POA: Diagnosis not present

## 2016-03-07 DIAGNOSIS — I10 Essential (primary) hypertension: Secondary | ICD-10-CM | POA: Diagnosis not present

## 2016-03-07 DIAGNOSIS — M1990 Unspecified osteoarthritis, unspecified site: Secondary | ICD-10-CM | POA: Diagnosis not present

## 2016-03-07 DIAGNOSIS — E559 Vitamin D deficiency, unspecified: Secondary | ICD-10-CM | POA: Diagnosis not present

## 2016-03-07 DIAGNOSIS — G4700 Insomnia, unspecified: Secondary | ICD-10-CM | POA: Diagnosis not present

## 2016-03-07 DIAGNOSIS — L659 Nonscarring hair loss, unspecified: Secondary | ICD-10-CM | POA: Diagnosis not present

## 2016-03-07 DIAGNOSIS — Z23 Encounter for immunization: Secondary | ICD-10-CM | POA: Diagnosis not present

## 2016-03-07 DIAGNOSIS — E782 Mixed hyperlipidemia: Secondary | ICD-10-CM | POA: Diagnosis not present

## 2016-04-20 DIAGNOSIS — R05 Cough: Secondary | ICD-10-CM | POA: Diagnosis not present

## 2016-04-20 DIAGNOSIS — B349 Viral infection, unspecified: Secondary | ICD-10-CM | POA: Diagnosis not present

## 2016-05-23 DIAGNOSIS — R52 Pain, unspecified: Secondary | ICD-10-CM

## 2016-06-07 DIAGNOSIS — R52 Pain, unspecified: Secondary | ICD-10-CM

## 2016-06-19 ENCOUNTER — Telehealth: Payer: Self-pay | Admitting: Podiatry

## 2016-06-19 NOTE — Telephone Encounter (Signed)
Dispensed refurbished orthotics to patient

## 2016-07-03 DIAGNOSIS — H5203 Hypermetropia, bilateral: Secondary | ICD-10-CM | POA: Diagnosis not present

## 2016-07-03 DIAGNOSIS — H524 Presbyopia: Secondary | ICD-10-CM | POA: Diagnosis not present

## 2016-07-03 DIAGNOSIS — H2513 Age-related nuclear cataract, bilateral: Secondary | ICD-10-CM | POA: Diagnosis not present

## 2016-07-19 DIAGNOSIS — K219 Gastro-esophageal reflux disease without esophagitis: Secondary | ICD-10-CM | POA: Diagnosis not present

## 2016-07-31 IMAGING — US US ABDOMEN COMPLETE
1 series · 13 of 25 positions shown · non-contrast
Comparison: 05/23/2011 .

CLINICAL DATA: Abdominal pain.

EXAM:
ABDOMEN ULTRASOUND COMPLETE

[Series 1: us abdomen complete · 0.30mm/px · 13 of 89 slices shown]
[im 1/89]
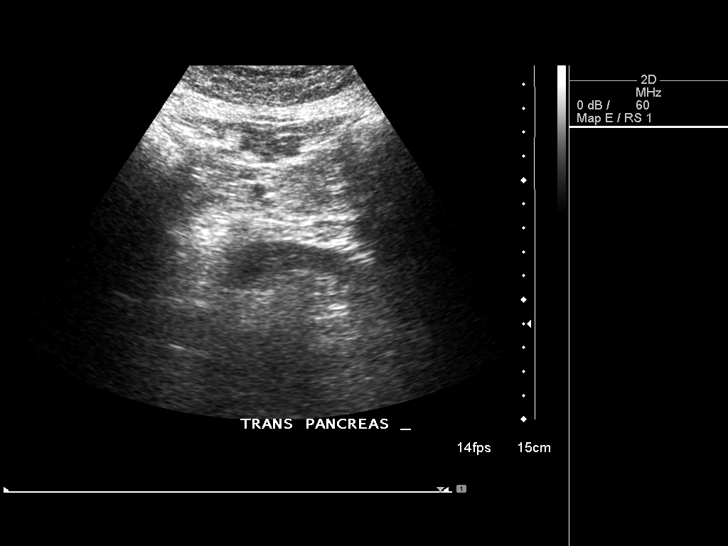
[im 8/89]
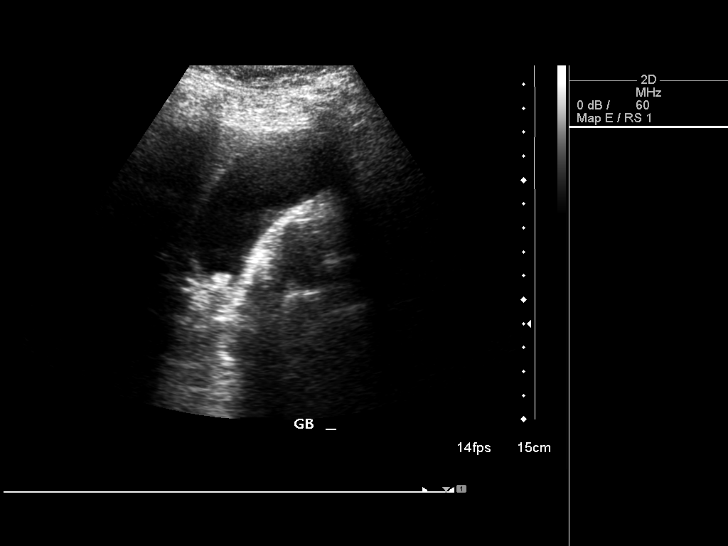
[im 15/89]
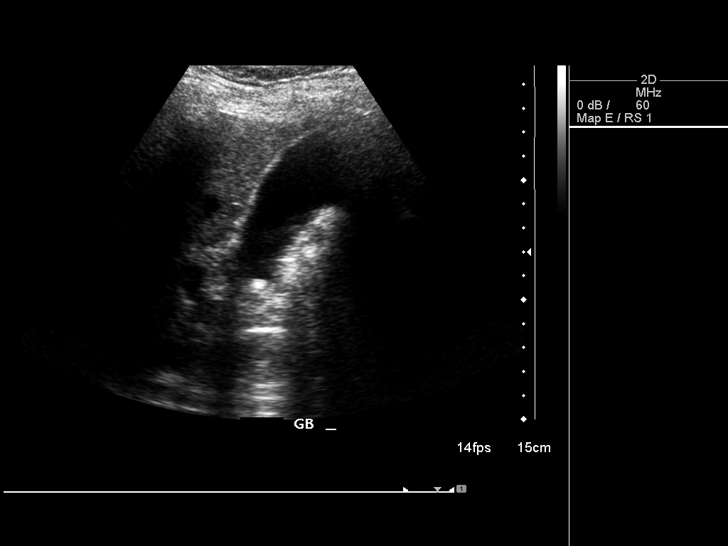
[im 23/89]
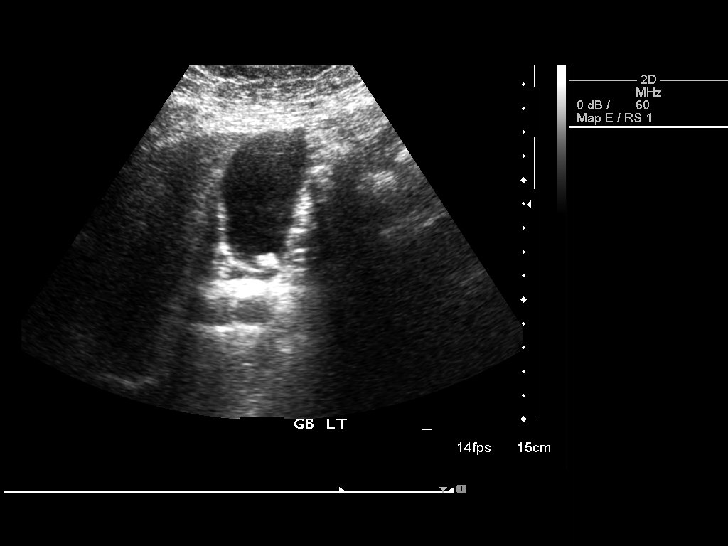
[im 30/89]
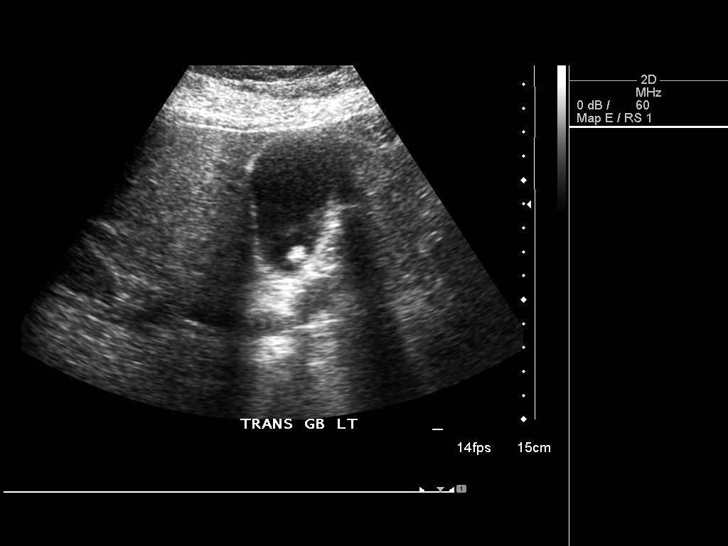
[im 37/89]
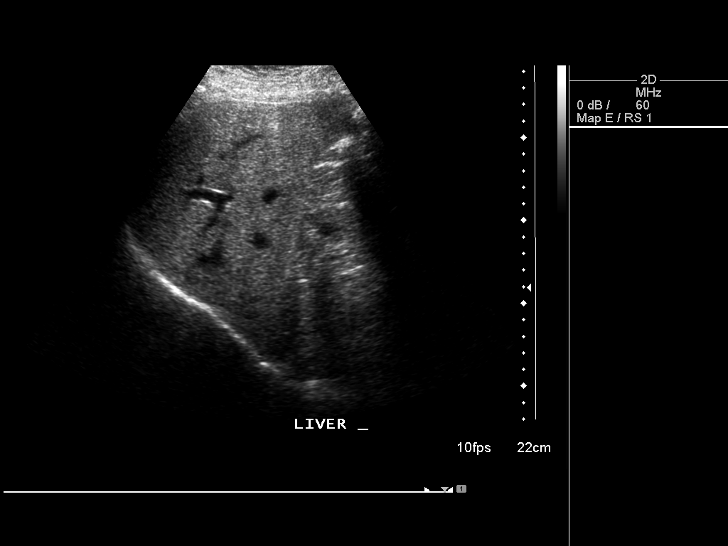
[im 45/89]
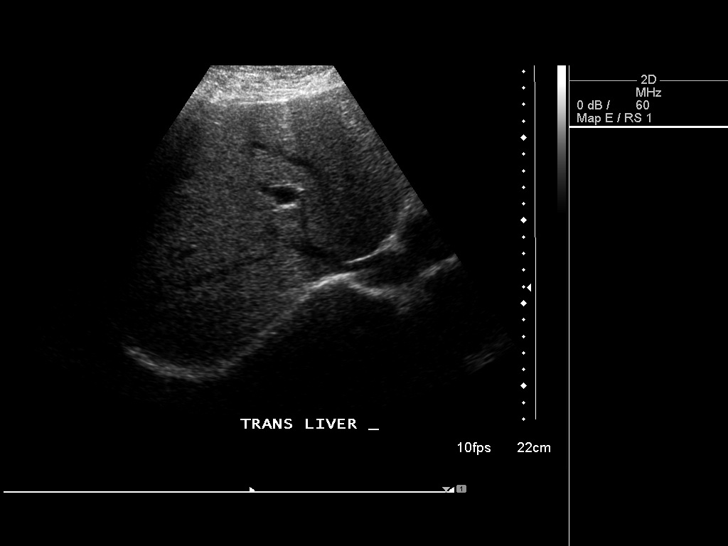
[im 52/89]
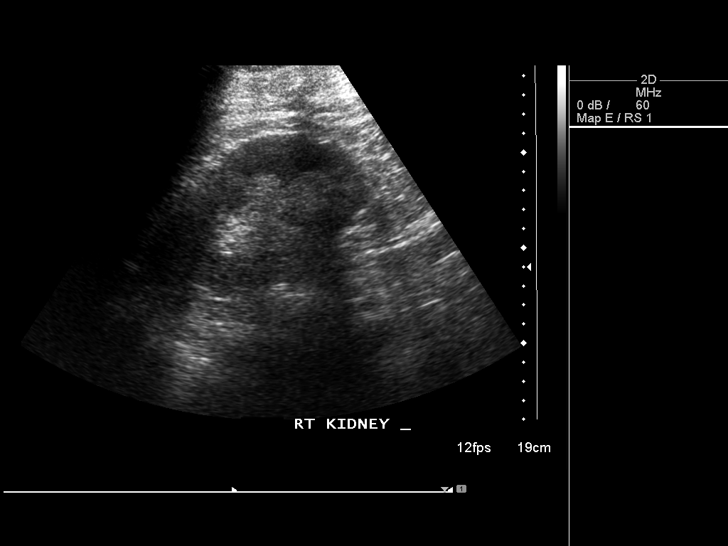
[im 59/89]
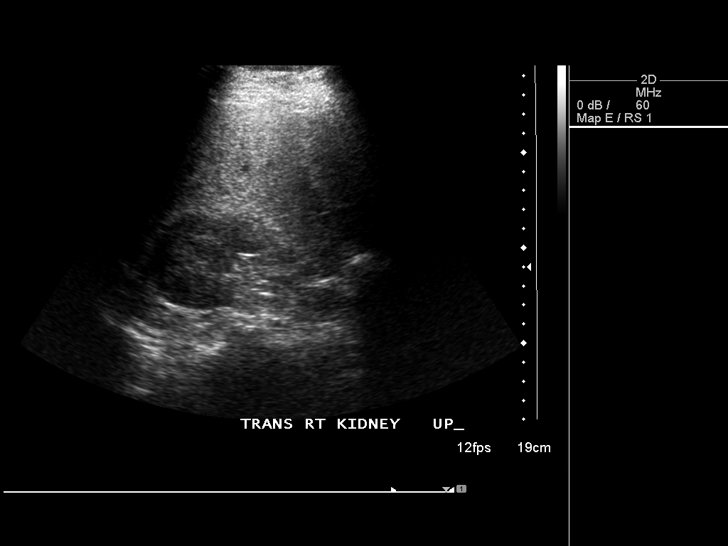
[im 67/89]
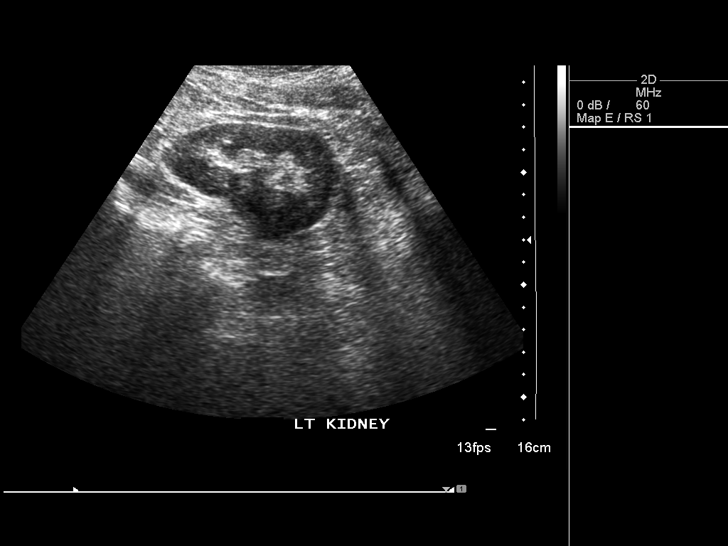
[im 74/89]
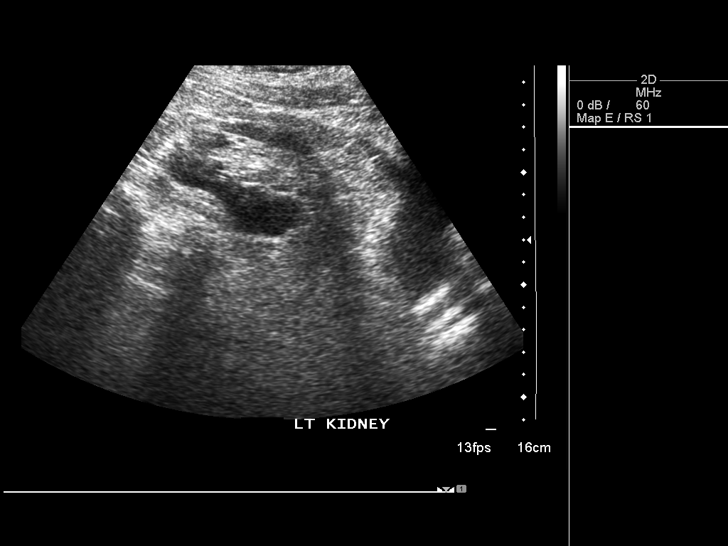
[im 81/89]
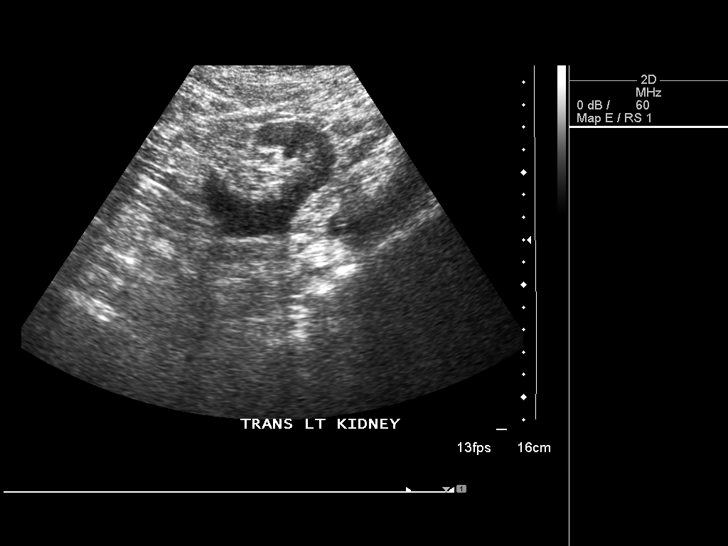
[im 89/89]
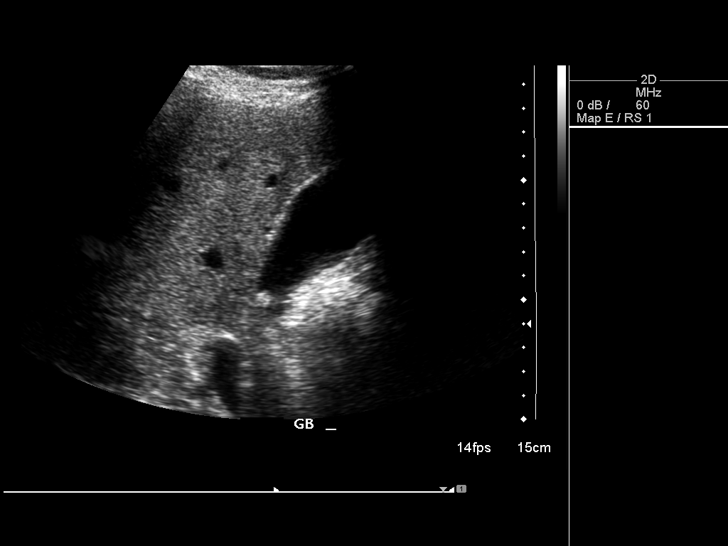

[13 of 25 positions shown; findings below may reference images not displayed]

FINDINGS: Gallbladder: Tiny echogenic non shadowing focus again noted in the
neck of the gallbladder measuring 9 mm. This consistent gallbladder
polyp. Similar findings noted on prior exam. Tiny echogenicities in
the gallbladder wall. Adenomyomatosis could present this fashion. No
gallstones. Gallbladder wall thickness 3 mm. No pericholecystic
fluid collection .

Common bile duct: Diameter: 3.9 mm

Liver: No focal lesion identified. Within normal limits in
parenchymal echogenicity.

IVC: No abnormality visualized.

Pancreas: Visualized portion unremarkable.

Spleen: Size and appearance within normal limits.

Right Kidney: Length: 10.9 cm. Echogenicity within normal limits. No
mass or hydronephrosis visualized.

Left Kidney: Length: 9.3 cm. Left kidney is a pelvic kidney with
slight malrotation . Echogenicity within normal limits. No mass or
hydronephrosis visualized.

Abdominal aorta: No aneurysm visualized.

Other findings: None.
IMPRESSION: 1. Small gallbladder polyp measuring 9 mm. Similar findings noted on
prior exam. Adenomyomatosis cannot be excluded . No gallstones or
biliary distention.

2.  Left pelvic kidney.

## 2016-10-12 DIAGNOSIS — M25512 Pain in left shoulder: Secondary | ICD-10-CM | POA: Diagnosis not present

## 2016-10-31 DIAGNOSIS — M47812 Spondylosis without myelopathy or radiculopathy, cervical region: Secondary | ICD-10-CM | POA: Diagnosis not present

## 2017-02-21 DIAGNOSIS — R3915 Urgency of urination: Secondary | ICD-10-CM | POA: Diagnosis not present

## 2017-02-21 DIAGNOSIS — N401 Enlarged prostate with lower urinary tract symptoms: Secondary | ICD-10-CM | POA: Diagnosis not present

## 2017-03-30 DIAGNOSIS — Z125 Encounter for screening for malignant neoplasm of prostate: Secondary | ICD-10-CM | POA: Diagnosis not present

## 2017-03-30 DIAGNOSIS — E559 Vitamin D deficiency, unspecified: Secondary | ICD-10-CM | POA: Diagnosis not present

## 2017-03-30 DIAGNOSIS — E782 Mixed hyperlipidemia: Secondary | ICD-10-CM | POA: Diagnosis not present

## 2017-03-30 DIAGNOSIS — I10 Essential (primary) hypertension: Secondary | ICD-10-CM | POA: Diagnosis not present

## 2017-04-04 DIAGNOSIS — Z23 Encounter for immunization: Secondary | ICD-10-CM | POA: Diagnosis not present

## 2017-04-04 DIAGNOSIS — G4700 Insomnia, unspecified: Secondary | ICD-10-CM | POA: Diagnosis not present

## 2017-04-04 DIAGNOSIS — Z1389 Encounter for screening for other disorder: Secondary | ICD-10-CM | POA: Diagnosis not present

## 2017-04-04 DIAGNOSIS — E782 Mixed hyperlipidemia: Secondary | ICD-10-CM | POA: Diagnosis not present

## 2017-04-04 DIAGNOSIS — E559 Vitamin D deficiency, unspecified: Secondary | ICD-10-CM | POA: Diagnosis not present

## 2017-04-04 DIAGNOSIS — M1990 Unspecified osteoarthritis, unspecified site: Secondary | ICD-10-CM | POA: Diagnosis not present

## 2017-04-04 DIAGNOSIS — I10 Essential (primary) hypertension: Secondary | ICD-10-CM | POA: Diagnosis not present

## 2017-04-04 DIAGNOSIS — K219 Gastro-esophageal reflux disease without esophagitis: Secondary | ICD-10-CM | POA: Diagnosis not present

## 2017-04-04 DIAGNOSIS — Z0000 Encounter for general adult medical examination without abnormal findings: Secondary | ICD-10-CM | POA: Diagnosis not present

## 2017-07-05 DIAGNOSIS — H2513 Age-related nuclear cataract, bilateral: Secondary | ICD-10-CM | POA: Diagnosis not present

## 2017-07-05 DIAGNOSIS — H5203 Hypermetropia, bilateral: Secondary | ICD-10-CM | POA: Diagnosis not present

## 2017-07-05 DIAGNOSIS — H52203 Unspecified astigmatism, bilateral: Secondary | ICD-10-CM | POA: Diagnosis not present

## 2017-12-26 DIAGNOSIS — R109 Unspecified abdominal pain: Secondary | ICD-10-CM | POA: Diagnosis not present

## 2017-12-26 DIAGNOSIS — R1013 Epigastric pain: Secondary | ICD-10-CM | POA: Diagnosis not present

## 2017-12-26 DIAGNOSIS — R140 Abdominal distension (gaseous): Secondary | ICD-10-CM | POA: Diagnosis not present

## 2017-12-28 ENCOUNTER — Other Ambulatory Visit: Payer: Self-pay | Admitting: Family Medicine

## 2017-12-28 DIAGNOSIS — R109 Unspecified abdominal pain: Secondary | ICD-10-CM

## 2017-12-28 DIAGNOSIS — R140 Abdominal distension (gaseous): Secondary | ICD-10-CM

## 2018-01-15 DIAGNOSIS — J069 Acute upper respiratory infection, unspecified: Secondary | ICD-10-CM | POA: Diagnosis not present

## 2018-01-23 ENCOUNTER — Ambulatory Visit
Admission: RE | Admit: 2018-01-23 | Discharge: 2018-01-23 | Disposition: A | Discharge status: Home / Self Care | Payer: Medicare Other | Source: Ambulatory Visit | Attending: Family Medicine | Admitting: Family Medicine

## 2018-01-23 DIAGNOSIS — R109 Unspecified abdominal pain: Secondary | ICD-10-CM

## 2018-01-23 DIAGNOSIS — R140 Abdominal distension (gaseous): Secondary | ICD-10-CM

## 2018-01-23 DIAGNOSIS — K824 Cholesterolosis of gallbladder: Secondary | ICD-10-CM | POA: Diagnosis not present

## 2018-02-06 DIAGNOSIS — M25562 Pain in left knee: Secondary | ICD-10-CM | POA: Diagnosis not present

## 2018-02-25 DIAGNOSIS — S01511A Laceration without foreign body of lip, initial encounter: Secondary | ICD-10-CM | POA: Diagnosis not present

## 2018-04-01 DIAGNOSIS — I10 Essential (primary) hypertension: Secondary | ICD-10-CM | POA: Diagnosis not present

## 2018-04-01 DIAGNOSIS — E559 Vitamin D deficiency, unspecified: Secondary | ICD-10-CM | POA: Diagnosis not present

## 2018-04-01 DIAGNOSIS — E782 Mixed hyperlipidemia: Secondary | ICD-10-CM | POA: Diagnosis not present

## 2018-04-05 DIAGNOSIS — E782 Mixed hyperlipidemia: Secondary | ICD-10-CM | POA: Diagnosis not present

## 2018-04-05 DIAGNOSIS — Z23 Encounter for immunization: Secondary | ICD-10-CM | POA: Diagnosis not present

## 2018-04-05 DIAGNOSIS — Z0000 Encounter for general adult medical examination without abnormal findings: Secondary | ICD-10-CM | POA: Diagnosis not present

## 2018-04-05 DIAGNOSIS — I10 Essential (primary) hypertension: Secondary | ICD-10-CM | POA: Diagnosis not present

## 2018-04-05 DIAGNOSIS — M1990 Unspecified osteoarthritis, unspecified site: Secondary | ICD-10-CM | POA: Diagnosis not present

## 2018-04-05 DIAGNOSIS — G4700 Insomnia, unspecified: Secondary | ICD-10-CM | POA: Diagnosis not present

## 2018-04-05 DIAGNOSIS — K219 Gastro-esophageal reflux disease without esophagitis: Secondary | ICD-10-CM | POA: Diagnosis not present

## 2018-04-05 DIAGNOSIS — E559 Vitamin D deficiency, unspecified: Secondary | ICD-10-CM | POA: Diagnosis not present

## 2018-07-08 DIAGNOSIS — H2513 Age-related nuclear cataract, bilateral: Secondary | ICD-10-CM | POA: Diagnosis not present

## 2018-07-08 DIAGNOSIS — H5203 Hypermetropia, bilateral: Secondary | ICD-10-CM | POA: Diagnosis not present

## 2018-07-08 DIAGNOSIS — H524 Presbyopia: Secondary | ICD-10-CM | POA: Diagnosis not present

## 2019-02-04 DIAGNOSIS — G4700 Insomnia, unspecified: Secondary | ICD-10-CM | POA: Diagnosis not present

## 2019-02-04 DIAGNOSIS — M549 Dorsalgia, unspecified: Secondary | ICD-10-CM | POA: Diagnosis not present

## 2019-02-06 DIAGNOSIS — M47812 Spondylosis without myelopathy or radiculopathy, cervical region: Secondary | ICD-10-CM | POA: Diagnosis not present

## 2019-02-06 DIAGNOSIS — E782 Mixed hyperlipidemia: Secondary | ICD-10-CM | POA: Diagnosis not present

## 2019-02-06 DIAGNOSIS — I10 Essential (primary) hypertension: Secondary | ICD-10-CM | POA: Diagnosis not present

## 2019-02-06 DIAGNOSIS — M1990 Unspecified osteoarthritis, unspecified site: Secondary | ICD-10-CM | POA: Diagnosis not present

## 2019-02-07 DIAGNOSIS — I700 Atherosclerosis of aorta: Secondary | ICD-10-CM | POA: Diagnosis not present

## 2019-02-07 DIAGNOSIS — S2239XD Fracture of one rib, unspecified side, subsequent encounter for fracture with routine healing: Secondary | ICD-10-CM | POA: Diagnosis not present

## 2019-02-18 DIAGNOSIS — Z23 Encounter for immunization: Secondary | ICD-10-CM | POA: Diagnosis not present

## 2019-03-12 DIAGNOSIS — N401 Enlarged prostate with lower urinary tract symptoms: Secondary | ICD-10-CM | POA: Diagnosis not present

## 2019-03-12 DIAGNOSIS — R351 Nocturia: Secondary | ICD-10-CM | POA: Diagnosis not present

## 2019-03-19 DIAGNOSIS — I10 Essential (primary) hypertension: Secondary | ICD-10-CM | POA: Diagnosis not present

## 2019-03-19 DIAGNOSIS — M47812 Spondylosis without myelopathy or radiculopathy, cervical region: Secondary | ICD-10-CM | POA: Diagnosis not present

## 2019-03-19 DIAGNOSIS — M1990 Unspecified osteoarthritis, unspecified site: Secondary | ICD-10-CM | POA: Diagnosis not present

## 2019-03-19 DIAGNOSIS — E782 Mixed hyperlipidemia: Secondary | ICD-10-CM | POA: Diagnosis not present

## 2019-05-06 DIAGNOSIS — M1990 Unspecified osteoarthritis, unspecified site: Secondary | ICD-10-CM | POA: Diagnosis not present

## 2019-05-06 DIAGNOSIS — E782 Mixed hyperlipidemia: Secondary | ICD-10-CM | POA: Diagnosis not present

## 2019-05-06 DIAGNOSIS — I10 Essential (primary) hypertension: Secondary | ICD-10-CM | POA: Diagnosis not present

## 2019-05-06 DIAGNOSIS — M47812 Spondylosis without myelopathy or radiculopathy, cervical region: Secondary | ICD-10-CM | POA: Diagnosis not present

## 2019-05-15 DIAGNOSIS — E559 Vitamin D deficiency, unspecified: Secondary | ICD-10-CM | POA: Diagnosis not present

## 2019-05-15 DIAGNOSIS — R935 Abnormal findings on diagnostic imaging of other abdominal regions, including retroperitoneum: Secondary | ICD-10-CM | POA: Diagnosis not present

## 2019-05-15 DIAGNOSIS — I700 Atherosclerosis of aorta: Secondary | ICD-10-CM | POA: Diagnosis not present

## 2019-05-15 DIAGNOSIS — M79643 Pain in unspecified hand: Secondary | ICD-10-CM | POA: Diagnosis not present

## 2019-05-15 DIAGNOSIS — I10 Essential (primary) hypertension: Secondary | ICD-10-CM | POA: Diagnosis not present

## 2019-05-15 DIAGNOSIS — E782 Mixed hyperlipidemia: Secondary | ICD-10-CM | POA: Diagnosis not present

## 2019-05-15 DIAGNOSIS — K219 Gastro-esophageal reflux disease without esophagitis: Secondary | ICD-10-CM | POA: Diagnosis not present

## 2019-05-15 DIAGNOSIS — Z0000 Encounter for general adult medical examination without abnormal findings: Secondary | ICD-10-CM | POA: Diagnosis not present

## 2019-05-16 DIAGNOSIS — K824 Cholesterolosis of gallbladder: Secondary | ICD-10-CM | POA: Diagnosis not present

## 2019-05-19 DIAGNOSIS — E782 Mixed hyperlipidemia: Secondary | ICD-10-CM | POA: Diagnosis not present

## 2019-05-19 DIAGNOSIS — E559 Vitamin D deficiency, unspecified: Secondary | ICD-10-CM | POA: Diagnosis not present

## 2019-05-19 DIAGNOSIS — I10 Essential (primary) hypertension: Secondary | ICD-10-CM | POA: Diagnosis not present

## 2019-06-10 DIAGNOSIS — Z86010 Personal history of colonic polyps: Secondary | ICD-10-CM | POA: Diagnosis not present

## 2019-06-10 DIAGNOSIS — Z8371 Family history of colonic polyps: Secondary | ICD-10-CM | POA: Diagnosis not present

## 2019-06-10 DIAGNOSIS — K449 Diaphragmatic hernia without obstruction or gangrene: Secondary | ICD-10-CM | POA: Diagnosis not present

## 2019-06-17 DIAGNOSIS — Z1159 Encounter for screening for other viral diseases: Secondary | ICD-10-CM | POA: Diagnosis not present

## 2019-06-23 DIAGNOSIS — Z86010 Personal history of colonic polyps: Secondary | ICD-10-CM | POA: Diagnosis not present

## 2019-06-23 DIAGNOSIS — D123 Benign neoplasm of transverse colon: Secondary | ICD-10-CM | POA: Diagnosis not present

## 2019-06-25 DIAGNOSIS — D123 Benign neoplasm of transverse colon: Secondary | ICD-10-CM | POA: Diagnosis not present

## 2019-07-01 DIAGNOSIS — M1990 Unspecified osteoarthritis, unspecified site: Secondary | ICD-10-CM | POA: Diagnosis not present

## 2019-07-01 DIAGNOSIS — E782 Mixed hyperlipidemia: Secondary | ICD-10-CM | POA: Diagnosis not present

## 2019-07-01 DIAGNOSIS — I10 Essential (primary) hypertension: Secondary | ICD-10-CM | POA: Diagnosis not present

## 2019-07-01 DIAGNOSIS — M47812 Spondylosis without myelopathy or radiculopathy, cervical region: Secondary | ICD-10-CM | POA: Diagnosis not present

## 2019-07-11 DIAGNOSIS — H2513 Age-related nuclear cataract, bilateral: Secondary | ICD-10-CM | POA: Diagnosis not present

## 2019-07-11 DIAGNOSIS — H5203 Hypermetropia, bilateral: Secondary | ICD-10-CM | POA: Diagnosis not present

## 2019-08-07 DIAGNOSIS — I10 Essential (primary) hypertension: Secondary | ICD-10-CM | POA: Diagnosis not present

## 2019-08-07 DIAGNOSIS — M1990 Unspecified osteoarthritis, unspecified site: Secondary | ICD-10-CM | POA: Diagnosis not present

## 2019-08-07 DIAGNOSIS — E782 Mixed hyperlipidemia: Secondary | ICD-10-CM | POA: Diagnosis not present

## 2019-08-07 DIAGNOSIS — M47812 Spondylosis without myelopathy or radiculopathy, cervical region: Secondary | ICD-10-CM | POA: Diagnosis not present

## 2019-10-21 DIAGNOSIS — M1990 Unspecified osteoarthritis, unspecified site: Secondary | ICD-10-CM | POA: Diagnosis not present

## 2019-10-21 DIAGNOSIS — E782 Mixed hyperlipidemia: Secondary | ICD-10-CM | POA: Diagnosis not present

## 2019-10-21 DIAGNOSIS — I10 Essential (primary) hypertension: Secondary | ICD-10-CM | POA: Diagnosis not present

## 2019-10-21 DIAGNOSIS — M47812 Spondylosis without myelopathy or radiculopathy, cervical region: Secondary | ICD-10-CM | POA: Diagnosis not present

## 2019-11-10 DIAGNOSIS — I10 Essential (primary) hypertension: Secondary | ICD-10-CM | POA: Diagnosis not present

## 2019-11-10 DIAGNOSIS — M1990 Unspecified osteoarthritis, unspecified site: Secondary | ICD-10-CM | POA: Diagnosis not present

## 2019-11-10 DIAGNOSIS — M47812 Spondylosis without myelopathy or radiculopathy, cervical region: Secondary | ICD-10-CM | POA: Diagnosis not present

## 2019-11-10 DIAGNOSIS — E782 Mixed hyperlipidemia: Secondary | ICD-10-CM | POA: Diagnosis not present

## 2019-11-17 DIAGNOSIS — M25551 Pain in right hip: Secondary | ICD-10-CM | POA: Diagnosis not present

## 2019-11-17 DIAGNOSIS — M519 Unspecified thoracic, thoracolumbar and lumbosacral intervertebral disc disorder: Secondary | ICD-10-CM | POA: Diagnosis not present

## 2019-11-25 DIAGNOSIS — M545 Low back pain: Secondary | ICD-10-CM | POA: Diagnosis not present

## 2020-02-06 DIAGNOSIS — M47812 Spondylosis without myelopathy or radiculopathy, cervical region: Secondary | ICD-10-CM | POA: Diagnosis not present

## 2020-02-06 DIAGNOSIS — E782 Mixed hyperlipidemia: Secondary | ICD-10-CM | POA: Diagnosis not present

## 2020-02-06 DIAGNOSIS — M1990 Unspecified osteoarthritis, unspecified site: Secondary | ICD-10-CM | POA: Diagnosis not present

## 2020-02-06 DIAGNOSIS — I10 Essential (primary) hypertension: Secondary | ICD-10-CM | POA: Diagnosis not present

## 2020-03-06 IMAGING — US US ABDOMEN COMPLETE
1 series · 13 of 25 positions shown · non-contrast
Comparison: Ultrasound June 03, 2015.

CLINICAL DATA: Chronic generalized abdominal pain and bloating.

EXAM:
ABDOMEN ULTRASOUND COMPLETE

[Series 1: us abdomen complete · 0.19mm/px · 13 of 94 slices shown]
[im 1/94]
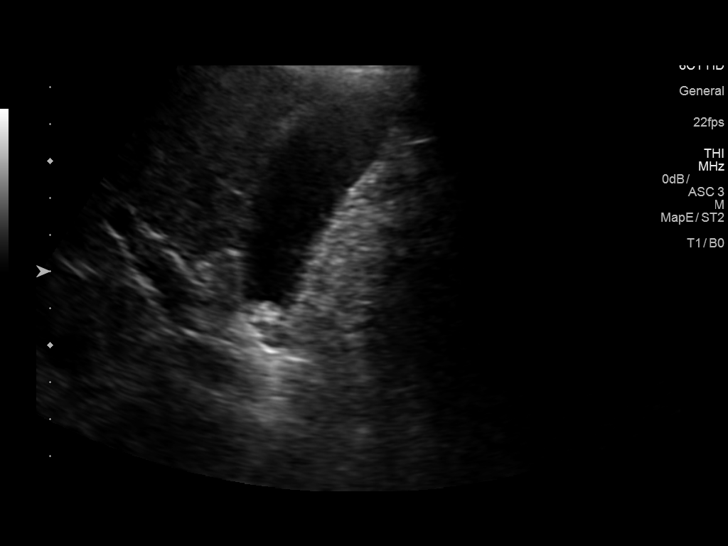
[im 8/94]
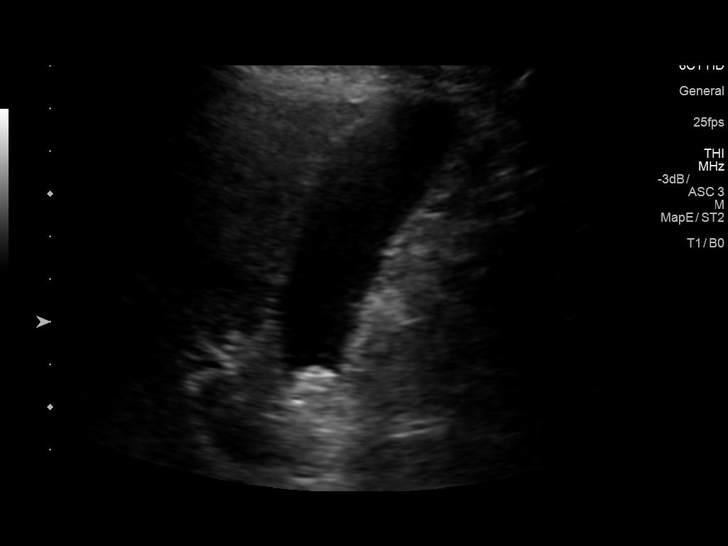
[im 16/94]
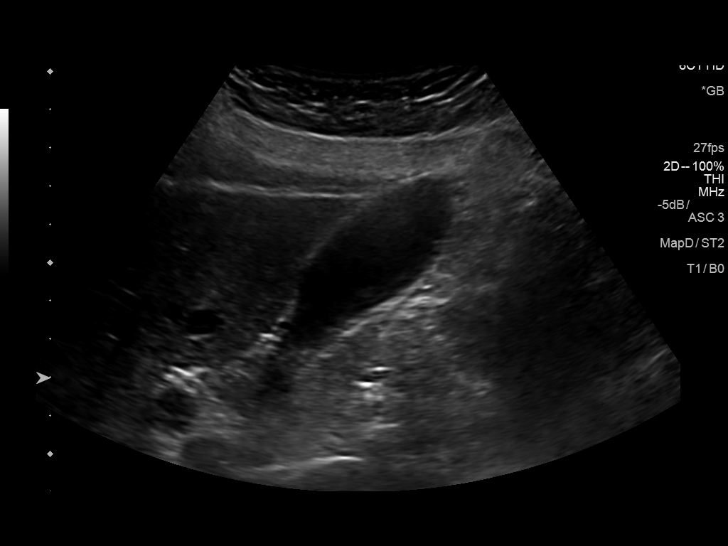
[im 24/94]
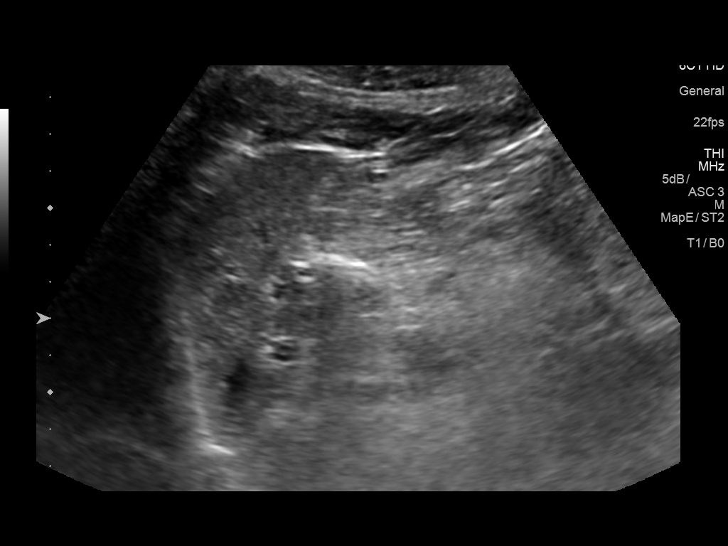
[im 32/94]
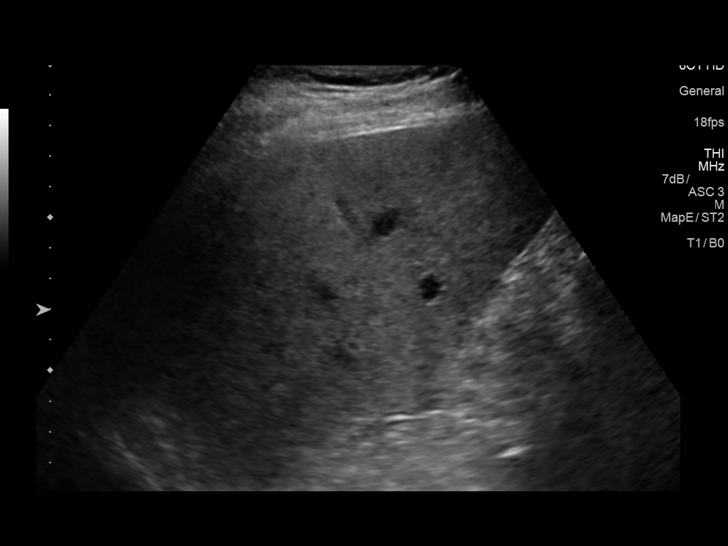
[im 39/94]
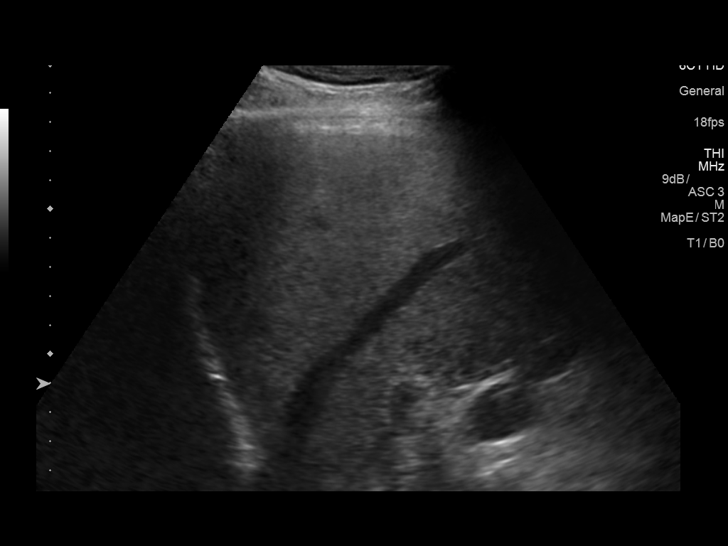
[im 47/94]
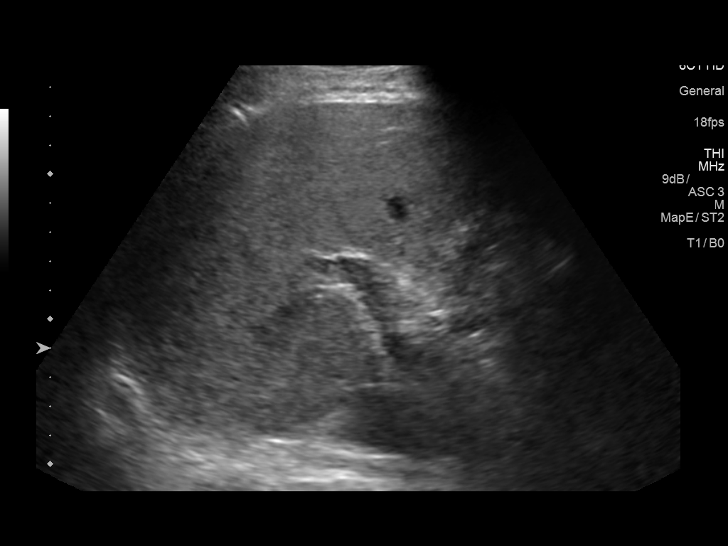
[im 55/94]
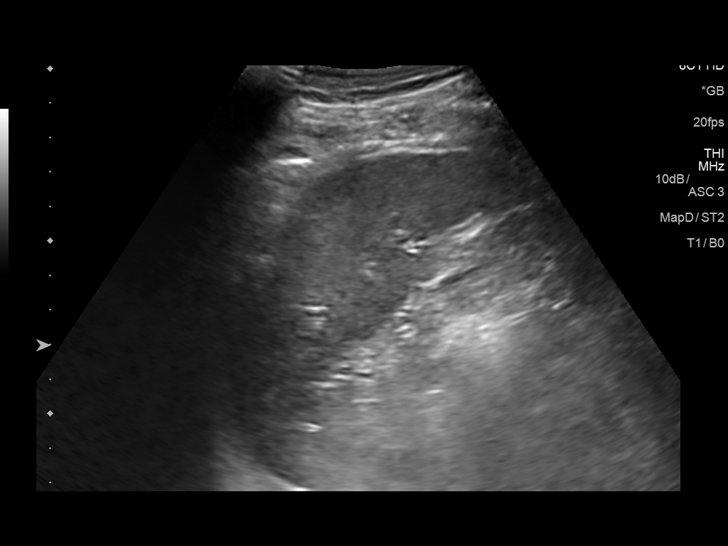
[im 63/94]
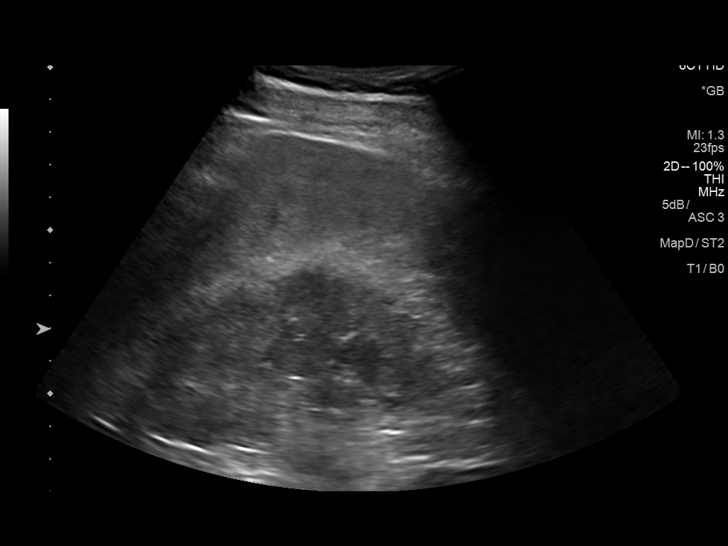
[im 70/94]
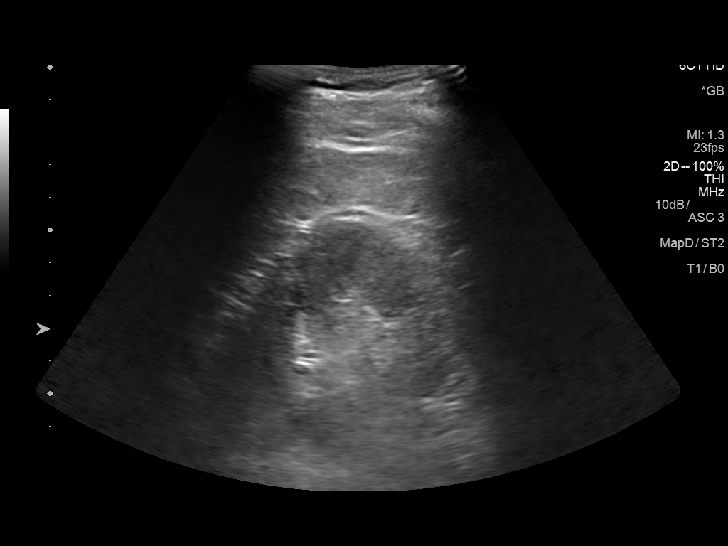
[im 78/94]
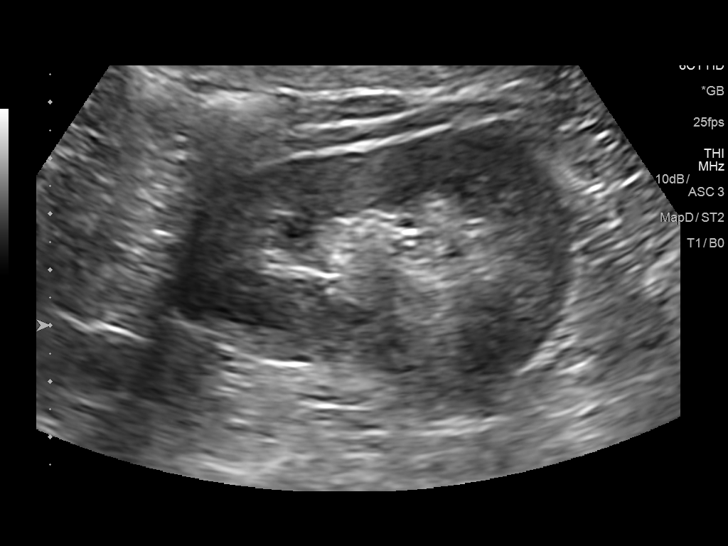
[im 86/94]
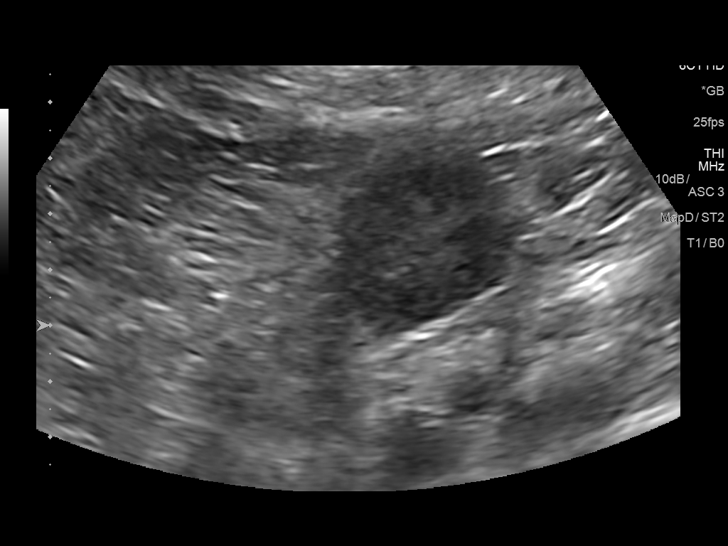
[im 94/94]
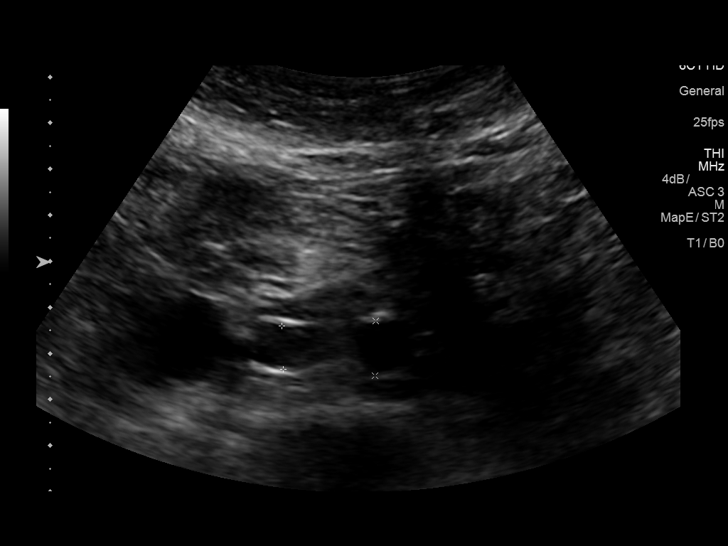

[13 of 25 positions shown; findings below may reference images not displayed]

FINDINGS: Gallbladder: 1 cm rounded echogenic nonshadowing focus is again
noted in neck of gallbladder which is not significantly changed
compared to prior exam. This is most consistent with polyp. No
definite gallstones or gallbladder wall thickening is noted. No
pericholecystic fluid is noted. No sonographic Murphy's sign is
noted.

Common bile duct: Diameter: 3 mm which is within normal limits.

Liver: No focal lesion identified. Within normal limits in
parenchymal echogenicity. Portal vein is patent on color Doppler
imaging with normal direction of blood flow towards the liver.

IVC: No abnormality visualized.

Pancreas: Not well visualized due to overlying bowel gas.

Spleen: Size and appearance within normal limits.

Right Kidney: Length: 11 cm. Echogenicity within normal limits. No
mass or hydronephrosis visualized.

Left Kidney: Length: 8 cm. This is a pelvic kidney and is somewhat
malrotated. Echogenicity within normal limits. No mass or
hydronephrosis visualized.

Abdominal aorta: No aneurysm visualized.

Other findings: None.
IMPRESSION: 1 cm probable gallbladder polyp is again noted which is not
significantly changed compared to prior exam. Follow-up ultrasound
in 1 year is recommended to ensure stability and rule out neoplasm
or malignancy.

Pancreas not visualized due to overlying bowel gas.

Pelvic left kidney is noted.

No significant change compared to prior exam. No acute abnormality
is noted.

## 2020-05-11 DIAGNOSIS — G4700 Insomnia, unspecified: Secondary | ICD-10-CM | POA: Diagnosis not present

## 2020-05-11 DIAGNOSIS — I10 Essential (primary) hypertension: Secondary | ICD-10-CM | POA: Diagnosis not present

## 2020-05-11 DIAGNOSIS — E782 Mixed hyperlipidemia: Secondary | ICD-10-CM | POA: Diagnosis not present

## 2020-05-11 DIAGNOSIS — M1990 Unspecified osteoarthritis, unspecified site: Secondary | ICD-10-CM | POA: Diagnosis not present

## 2020-05-11 DIAGNOSIS — K219 Gastro-esophageal reflux disease without esophagitis: Secondary | ICD-10-CM | POA: Diagnosis not present

## 2020-05-11 DIAGNOSIS — M47812 Spondylosis without myelopathy or radiculopathy, cervical region: Secondary | ICD-10-CM | POA: Diagnosis not present

## 2020-05-31 DIAGNOSIS — Z1389 Encounter for screening for other disorder: Secondary | ICD-10-CM | POA: Diagnosis not present

## 2020-05-31 DIAGNOSIS — Z0000 Encounter for general adult medical examination without abnormal findings: Secondary | ICD-10-CM | POA: Diagnosis not present

## 2020-06-01 DIAGNOSIS — E782 Mixed hyperlipidemia: Secondary | ICD-10-CM | POA: Diagnosis not present

## 2020-06-01 DIAGNOSIS — I700 Atherosclerosis of aorta: Secondary | ICD-10-CM | POA: Diagnosis not present

## 2020-06-01 DIAGNOSIS — D649 Anemia, unspecified: Secondary | ICD-10-CM | POA: Diagnosis not present

## 2020-06-01 DIAGNOSIS — I10 Essential (primary) hypertension: Secondary | ICD-10-CM | POA: Diagnosis not present

## 2020-06-01 DIAGNOSIS — K219 Gastro-esophageal reflux disease without esophagitis: Secondary | ICD-10-CM | POA: Diagnosis not present

## 2020-06-01 DIAGNOSIS — G4700 Insomnia, unspecified: Secondary | ICD-10-CM | POA: Diagnosis not present

## 2020-06-01 DIAGNOSIS — R0609 Other forms of dyspnea: Secondary | ICD-10-CM | POA: Diagnosis not present

## 2020-06-01 DIAGNOSIS — E559 Vitamin D deficiency, unspecified: Secondary | ICD-10-CM | POA: Diagnosis not present

## 2020-06-13 NOTE — Progress Notes (Signed)
Cardiology Office Note:    Date:  06/14/2020   ID:  Joseph Reilly, DOB [birth year], MRN [id number]  PCP:  Lawerance Cruel, [place]  Cardiologist:  No primary care provider on file.  Advanced Practice Provider:  No care team member to display Electrophysiologist:  None   Referring MD: Lawerance Cruel, MD    History of Present Illness:    Joseph Reilly is a 75 y.o. male with a hx of CAD, HTN, HLD and GERD who was referred by Dr. Harrington Challenger for further evaluation for dyspnea on exertion  Last saw Dr. [place]ique in 2013 for epigastric discomfort. Stress echo 2013 normal.   Patient states that when he walks a flight of stairs he feels more dyspneic. Also notes it when walking faster, he feels more short winded. No chest discomfort when this is occurring. No LE edema, orthopnea, PND, nausea or vomiting. Has occasional palpitations. No lightheadedness or dizziness. No syncope. Blood pressure is well controlled at home.   Admits that he is not as active as he used to be.  Reported history of cath in 2010 with some minor blockage. Films not available.    Also has a nagging sensation that migrates across the chest and back while at rest. These symptoms are not exertional  Past Medical History:  Diagnosis Date  . Adenomatous polyp of colon   . Arthritis   . CAD (coronary artery disease)   . GERD (gastroesophageal reflux disease)   . Hemorrhoids   . Hiatal hernia    small   . History of gallstones   . Hyperlipemia   . Hypertension   . IBS (irritable bowel syndrome)   . Trigger finger    bilateral thumbs  . Varicose veins     Past Surgical History:  Procedure Laterality Date  . CARDIAC CATHETERIZATION  2011  . CARDIAC CATHETERIZATION  07/14/2008   EF 60%  . ENDOVENOUS ABLATION SAPHENOUS VEIN W/ LASER Right 10-22-2014   endovenous laser ablation right greater saphenous vein by Curt Jews MD  . ENDOVENOUS ABLATION SAPHENOUS VEIN W/ LASER Left  11-19-2014   endovenous laser ablation (left greater saphenous vein) by Curt Jews MD  . HYDROCELE EXCISION / REPAIR    . LUMBAR SPINE SURGERY    . TONSILLECTOMY    . TRANSTHORACIC ECHOCARDIOGRAM  07/14/2008   EF 60%    Current Medications: Current Meds  Medication Sig  . amitriptyline (ELAVIL) 25 MG tablet Take 25 mg by mouth. 1/4 tablet by mouth daily  . aspirin 81 MG tablet Take 81 mg by mouth daily.  Marland Kitchen B-Complex CAPS daily.  . Cholecalciferol (VITAMIN D3) 1000 UNITS CAPS Take 1 capsule by mouth 2 (two) times daily.  . Coenzyme Q10 200 MG capsule Take 200 mg by mouth daily.  . cromolyn (NASALCROM) 5.2 MG/ACT nasal spray Place 1 spray into the nose as needed.  . Glucosamine Sulfate 1000 MG CAPS Take 1 capsule by mouth 2 (two) times daily.  Marland Kitchen ibuprofen (ADVIL) 200 MG tablet as needed.  Marland Kitchen losartan (COZAAR) 50 MG tablet One tablet by mouth once daily  . Magnesium 250 MG TABS daily.  . Melatonin 3 MG CAPS Take by mouth. 1/4  Tablet by mouth once daily  . meloxicam (MOBIC) 15 MG tablet as needed.  . metoprolol tartrate (LOPRESSOR) 50 MG tablet Take one tablet by mouth 2 hours prior to CT  . Multiple Vitamin (MULTI-VITAMIN PO) Take by mouth daily.  Marland Kitchen  omeprazole (PRILOSEC) 40 MG capsule Take 40 mg by mouth daily.  . Plant Sterols and Stanols (CHOLESTOFF) 450 MG TABS 2 tablets in the morning and at bedtime.  . rosuvastatin (CRESTOR) 5 MG tablet   . sucralfate (CARAFATE) 1 g tablet Take 1 g by mouth 2 (two) times daily.  . [DISCONTINUED] vitamin B-12 (CYANOCOBALAMIN) 500 MCG tablet Take 500 mcg by mouth daily.     Allergies:   Lisinopril and Lotensin [benazepril hcl]   Social History   Socioeconomic History  . Marital status: Married    Spouse name: Not on file  . Number of children: 1  . Years of education: Not on file  . Highest education level: Not on file  Occupational History  . Occupation: Retired    Comment: [employer]  Tobacco Use  . Smoking status: Never Smoker  .  Smokeless tobacco: Never Used  Substance and Sexual Activity  . Alcohol use: No    Alcohol/week: 0.0 standard drinks  . Drug use: No  . Sexual activity: Not on file  Other Topics Concern  . Not on file  Social History Narrative   Caffeine drink daily    Social Determinants of Health   Financial Resource Strain: Not on file  Food Insecurity: Not on file  Transportation Needs: Not on file  Physical Activity: Not on file  Stress: Not on file  Social Connections: Not on file     Family History: The patient's family history includes Diabetes (age of onset: 64) in his father; Heart disease in his mother. There is no history of Colon cancer.  ROS:   Please see the history of present illness.    Review of Systems  Constitutional: Negative for chills, fever and malaise/fatigue.  HENT: Negative for nosebleeds.   Eyes: Negative for blurred vision and redness.  Respiratory: Positive for shortness of breath.   Cardiovascular: Negative for chest pain, palpitations, orthopnea, claudication, leg swelling and PND.  Gastrointestinal: Negative for melena, nausea and vomiting.  Genitourinary: Negative for hematuria.  Musculoskeletal: Positive for myalgias. Negative for falls.  Neurological: Negative for dizziness and loss of consciousness.  Endo/Heme/Allergies: Negative for polydipsia.  Psychiatric/Behavioral: Negative for substance abuse.    EKGs/Labs/Other Studies Reviewed:    The following studies were reviewed today: Stress Echo 2013: Study Conclusions   - Stress ECG conclusions: The stress ECG was normal.  - Baseline: LV global systolic function was normal. The  estimated LV ejection fraction was 65%. Normal wall  motion; no LV regional wall motion abnormalities.  - Peak stress: LV global systolic function was vigorous.  Normal wall motion; no LV regional wall motion  abnormalities.  - Impressions: Normal stress echo.  Impressions:   - Normal stress echo.   TTE  2010: LEFT VENTRICLE:  - The left ventricle was grossly normal.  - Left ventricular size was normal.  - Overall left ventricular systolic function was normal.  - Left ventricular ejection fraction was estimated to be 60 %.  - There were no left ventricular regional wall motion     abnormalities.  - Left ventricular wall thickness was mildly increased.   AORTIC VALVE:  - The aortic valve was grossly normal.   AORTA:  - The aorta was grossly normal.   LEFT ATRIUM:  - Left atrial size was at the upper limits of normal.   RIGHT VENTRICLE:  - Right ventricular size was normal.  - Right ventricular systolic function was normal.  - Right ventricular wall thickness was  normal.   TRICUSPID VALVE:  - The tricuspid valve was grossly normal.   RIGHT ATRIUM:  - The right atrium was grossly normal.   PERICARDIUM:  - The pericardium was grossly normal.  - The amount of pericardial fluid appeared to be at the upper     limits of normal.    EKG:  EKG is  ordered today.  The ekg ordered today demonstrates NSR with HR 63  Recent Labs: No results found for requested labs within last 8760 hours.  Recent Lipid Panel    Component Value Date/Time   CHOL  07/14/2008 0355    156        ATP III CLASSIFICATION:  <200     mg/dL   Desirable  200-239  mg/dL   Borderline High  >=240    mg/dL   High          TRIG 60 07/14/2008 0355   HDL 36 (L) 07/14/2008 0355   CHOLHDL 4.3 07/14/2008 0355   VLDL 12 07/14/2008 0355   LDLCALC (H) 07/14/2008 0355    108        Total Cholesterol/HDL:CHD Risk Coronary Heart Disease Risk Table                     Men   Women  1/2 Average Risk   3.4   3.3  Average Risk       5.0   4.4  2 X Average Risk   9.6   7.1  3 X Average Risk  23.4   11.0        Use the calculated Patient Ratio above and the CHD Risk Table to determine the patient's CHD Risk.        ATP III CLASSIFICATION (LDL):  <100     mg/dL    Optimal  100-129  mg/dL   Near or Above                    Optimal  130-159  mg/dL   Borderline  160-189  mg/dL   High  >190     mg/dL   Very High     Physical Exam:    VS:  BP 138/88   Pulse 63   Ht 5\' 10"  (1.778 m)   Wt 197 lb (89.4 kg)   SpO2 98%   BMI 28.27 kg/m     Wt Readings from Last 3 Encounters:  06/14/20 197 lb (89.4 kg)  09/24/15 198 lb 6.4 oz (90 kg)  12/01/14 200 lb 4.8 oz (90.9 kg)     GEN:  Well nourished, well developed in no acute distress HEENT: Normal NECK: No JVD; No carotid bruits CARDIAC: RRR, no murmurs, rubs, gallops RESPIRATORY:  Clear to auscultation without rales, wheezing or rhonchi  ABDOMEN: Soft, non-tender, non-distended MUSCULOSKELETAL:  No edema; No deformity  SKIN: Warm and dry NEUROLOGIC:  Alert and oriented x 3 PSYCHIATRIC:  Normal affect   ASSESSMENT:    1. DOE (dyspnea on exertion)   2. Precordial pain   3. Primary hypertension   4. Mixed hyperlipidemia    PLAN:    In order of problems listed above:  #Dyspnea on exertion: Patient noted worsening dyspnea on exertion when walking stairs or walking at a faster pace. Has been getting worse over the past several months. Has occasional migrating chest discomfort but this can occur at rest and does not correlate with exertion. Prior stress in 2013 normal but has not followed  up since. Has several risk factors for CAD including HTN, HLD, and prior reported nonobstructive CAD on cath at OSH in 2010. -Coronary CTA -TTE -Continue ASA 81mg , crestor 5mg  daily -Continue iron supplementation for low iron levels on recent labs  #HTN: Well controlled at home. -Continue losartan 50mg  daily  #HLD: -Crestor 5mg  daily    Medication Adjustments/Labs and Tests Ordered: Current medicines are reviewed at length with the patient today.  Concerns regarding medicines are outlined above.  Orders Placed This Encounter  Procedures  . CT CORONARY MORPH W/CTA COR W/SCORE W/CA W/CM &/OR WO/CM  .  Basic metabolic panel  . EKG 12-Lead  . ECHOCARDIOGRAM COMPLETE   Meds ordered this encounter  Medications  . metoprolol tartrate (LOPRESSOR) 50 MG tablet    Sig: Take one tablet by mouth 2 hours prior to CT    Dispense:  1 tablet    Refill:  0    Patient Instructions  Medication Instructions:  Your physician recommends that you continue on your current medications as directed. Please refer to the Current Medication list given to you today.  *If you need a refill on your cardiac medications before your next appointment, please call your pharmacy*   Lab Work: BMET today  If you have labs (blood work) drawn today and your tests are completely normal, you will receive your results only by: Marland Kitchen MyChart Message (if you have MyChart) OR . A paper copy in the mail If you have any lab test that is abnormal or we need to change your treatment, we will call you to review the results.   Testing/Procedures: Your physician has requested that you have an echocardiogram. Echocardiography is a painless test that uses sound waves to create images of your heart. It provides your doctor with information about the size and shape of your heart and how well your heart's chambers and valves are working. This procedure takes approximately one hour. There are no restrictions for this procedure.  Your physician recommends that you have a Coronary CT performed.   Follow-Up: At [facility], you and your health needs are our priority.  As part of our continuing mission to provide you with exceptional heart care, we have created designated Provider Care Teams.  These Care Teams include your primary Cardiologist (physician) and Advanced Practice Providers (APPs -  Physician Assistants and Nurse Practitioners) who all work together to provide you with the care you need, when you need it.  We recommend signing up for the patient portal called "MyChart".  Sign up information is provided on this After Visit  Summary.  MyChart is used to connect with patients for Virtual Visits (Telemedicine).  Patients are able to view lab/test results, encounter notes, upcoming appointments, etc.  Non-urgent messages can be sent to your provider as well.   To learn more about what you can do with MyChart, go to [email or web address].    Your next appointment:   6 month(s)  The format for your next appointment:   In Person  Provider:   You may see Gwyndolyn Kaufman, MD or one of the following Advanced Practice Providers on your designated Care Team:    Richardson Dopp, PA-C  Vin [place], [place]    Other Instructions  Your cardiac CT will be scheduled at one of the below locations:   [facility] [address] [place], [place] [address] [phone]  [place] [address] [place], [place] [address] [phone]  If scheduled at [facility], please arrive at the [facility] main entrance (entrance A) of [facility] 30 minutes prior to test start time. Proceed to the [facility] Radiology Department (first floor) to check-in and test prep.  If scheduled at [facility], please arrive 15 mins early for check-in and test prep.  Please follow these instructions carefully (unless otherwise directed):  Hold all erectile dysfunction medications at least 3 days (72 hrs) prior to test.  On the Night Before the Test: . Be sure to Drink plenty of water. . Do not consume any caffeinated/decaffeinated beverages or chocolate 12 hours prior to your test. . Do not take any antihistamines 12 hours prior to your test.  On the Day of the Test: . Drink plenty of water until 1 hour prior to the test. . Do not eat any food 4 hours prior to the test. . You may take your regular medications prior to the test.  . Take metoprolol (Lopressor) two hours prior to test. . HOLD Furosemide/Hydrochlorothiazide  morning of the test.       After the Test: . Drink plenty of water. . After receiving IV contrast, you may experience a mild flushed feeling. This is normal. . On occasion, you may experience a mild rash up to 24 hours after the test. This is not dangerous. If this occurs, you can take Benadryl 25 mg and increase your fluid intake. . If you experience trouble breathing, this can be serious. If it is severe call 911 IMMEDIATELY. If it is mild, please call our office. . If you take any of these medications: Glipizide/Metformin, Avandament, Glucavance, please do not take 48 hours after completing test unless otherwise instructed.   Once we have confirmed authorization from your insurance company, we will call you to set up a date and time for your test. Based on how quickly your insurance processes prior authorizations requests, please allow up to 4 weeks to be contacted for scheduling your Cardiac CT appointment. Be advised that routine Cardiac CT appointments could be scheduled as many as 8 weeks after your provider has ordered it.  For non-scheduling related questions, please contact the cardiac imaging nurse navigator should you have any questions/concerns: Marchia Bond, Cardiac Imaging Nurse Navigator Gordy Clement, Cardiac Imaging Nurse Navigator [facility] Direct Office Dial: [phone]   For scheduling needs, including cancellations and rescheduling, please call [place], [phone].       Signed, Freada Bergeron, MD  06/14/2020 12:02 PM    [place]

## 2020-06-14 ENCOUNTER — Encounter: Payer: Self-pay | Admitting: Cardiology

## 2020-06-14 ENCOUNTER — Other Ambulatory Visit: Payer: Self-pay

## 2020-06-14 ENCOUNTER — Ambulatory Visit (INDEPENDENT_AMBULATORY_CARE_PROVIDER_SITE_OTHER): Payer: Medicare Other | Admitting: Cardiology

## 2020-06-14 VITALS — BP 138/88 | HR 63 | Ht 70.0 in | Wt 197.0 lb

## 2020-06-14 DIAGNOSIS — I10 Essential (primary) hypertension: Secondary | ICD-10-CM

## 2020-06-14 DIAGNOSIS — E782 Mixed hyperlipidemia: Secondary | ICD-10-CM

## 2020-06-14 DIAGNOSIS — R072 Precordial pain: Secondary | ICD-10-CM | POA: Diagnosis not present

## 2020-06-14 DIAGNOSIS — R0600 Dyspnea, unspecified: Secondary | ICD-10-CM | POA: Diagnosis not present

## 2020-06-14 DIAGNOSIS — R0609 Other forms of dyspnea: Secondary | ICD-10-CM

## 2020-06-14 LAB — BASIC METABOLIC PANEL
BUN/Creatinine Ratio: 20 (ref 10–24)
BUN: 18 mg/dL (ref 8–27)
CO2: 22 mmol/L (ref 20–29)
Calcium: 9.1 mg/dL (ref 8.6–10.2)
Chloride: 105 mmol/L (ref 96–106)
Creatinine, Ser: 0.89 mg/dL (ref 0.76–1.27)
GFR calc Af Amer: 98 mL/min/{1.73_m2} (ref >59)
GFR calc non Af Amer: 85 mL/min/{1.73_m2} (ref >59)
Glucose: 97 mg/dL (ref 65–99)
Potassium: 4.8 mmol/L (ref 3.5–5.2)
Sodium: 140 mmol/L (ref 134–144)

## 2020-06-14 MED ORDER — METOPROLOL TARTRATE 50 MG PO TABS: ORAL_TABLET | ORAL | 0 refills | Status: DC

## 2020-06-14 NOTE — Patient Instructions (Addendum)
Medication Instructions:  Your physician recommends that you continue on your current medications as directed. Please refer to the Current Medication list given to you today.  *If you need a refill on your cardiac medications before your next appointment, please call your pharmacy*   Lab Work: BMET today  If you have labs (blood work) drawn today and your tests are completely normal, you will receive your results only by: Marland Kitchen MyChart Message (if you have MyChart) OR . A paper copy in the mail If you have any lab test that is abnormal or we need to change your treatment, we will call you to review the results.   Testing/Procedures: Your physician has requested that you have an echocardiogram. Echocardiography is a painless test that uses sound waves to create images of your heart. It provides your doctor with information about the size and shape of your heart and how well your heart's chambers and valves are working. This procedure takes approximately one hour. There are no restrictions for this procedure.  Your physician recommends that you have a Coronary CT performed.   Follow-Up: At [facility], you and your health needs are our priority.  As part of our continuing mission to provide you with exceptional heart care, we have created designated Provider Care Teams.  These Care Teams include your primary Cardiologist (physician) and Advanced Practice Providers (APPs -  Physician Assistants and Nurse Practitioners) who all work together to provide you with the care you need, when you need it.  We recommend signing up for the patient portal called "MyChart".  Sign up information is provided on this After Visit Summary.  MyChart is used to connect with patients for Virtual Visits (Telemedicine).  Patients are able to view lab/test results, encounter notes, upcoming appointments, etc.  Non-urgent messages can be sent to your provider as well.   To learn more about what you can do with MyChart,  go to [email or web address].    Your next appointment:   6 month(s)  The format for your next appointment:   In Person  Provider:   You may see Gwyndolyn Kaufman, MD or one of the following Advanced Practice Providers on your designated Care Team:    Richardson Dopp, PA-C  Vin [place], [place]    Other Instructions  Your cardiac CT will be scheduled at one of the below locations:   [facility] [address] [place], [place] [address] [phone]  [place] [address] [place], [place] [address] [phone]  If scheduled at [facility], please arrive at the [facility] main entrance (entrance A) of [facility] 30 minutes prior to test start time. Proceed to the [facility] Radiology Department (first floor) to check-in and test prep.  If scheduled at [facility], please arrive 15 mins early for check-in and test prep.  Please follow these instructions carefully (unless otherwise directed):  Hold all erectile dysfunction medications at least 3 days (72 hrs) prior to test.  On the Night Before the Test: . Be sure to Drink plenty of water. . Do not consume any caffeinated/decaffeinated beverages or chocolate 12 hours prior to your test. . Do not take any antihistamines 12 hours prior to your test.  On the Day of the Test: . Drink plenty of water until 1 hour prior to the test. . Do not eat any food 4 hours prior to the test. . You may take your regular medications  prior to the test.  . Take metoprolol (Lopressor) two hours prior to test. . HOLD Furosemide/Hydrochlorothiazide morning of the test.       After the Test: . Drink plenty of water. . After receiving IV contrast, you may experience a mild flushed feeling. This is normal. . On occasion, you may experience a mild rash up to 24 hours after the test. This is not dangerous. If this occurs, you can take  Benadryl 25 mg and increase your fluid intake. . If you experience trouble breathing, this can be serious. If it is severe call 911 IMMEDIATELY. If it is mild, please call our office. . If you take any of these medications: Glipizide/Metformin, Avandament, Glucavance, please do not take 48 hours after completing test unless otherwise instructed.   Once we have confirmed authorization from your insurance company, we will call you to set up a date and time for your test. Based on how quickly your insurance processes prior authorizations requests, please allow up to 4 weeks to be contacted for scheduling your Cardiac CT appointment. Be advised that routine Cardiac CT appointments could be scheduled as many as 8 weeks after your provider has ordered it.  For non-scheduling related questions, please contact the cardiac imaging nurse navigator should you have any questions/concerns: Marchia Bond, Cardiac Imaging Nurse Navigator Gordy Clement, Cardiac Imaging Nurse Navigator [facility] Direct Office Dial: [phone]   For scheduling needs, including cancellations and rescheduling, please call [place], [phone].

## 2020-07-07 ENCOUNTER — Other Ambulatory Visit: Payer: Self-pay

## 2020-07-07 ENCOUNTER — Ambulatory Visit (HOSPITAL_COMMUNITY): Payer: Medicare Other | Attending: Cardiology

## 2020-07-07 DIAGNOSIS — R0609 Other forms of dyspnea: Secondary | ICD-10-CM

## 2020-07-07 DIAGNOSIS — R0600 Dyspnea, unspecified: Secondary | ICD-10-CM | POA: Diagnosis not present

## 2020-07-07 LAB — ECHOCARDIOGRAM COMPLETE
Area-P 1/2: 3.27 cm2
S' Lateral: 3.5 cm

## 2020-07-12 DIAGNOSIS — H5203 Hypermetropia, bilateral: Secondary | ICD-10-CM | POA: Diagnosis not present

## 2020-07-12 DIAGNOSIS — H2513 Age-related nuclear cataract, bilateral: Secondary | ICD-10-CM | POA: Diagnosis not present

## 2020-07-28 ENCOUNTER — Telehealth: Payer: Self-pay | Admitting: *Deleted

## 2020-07-28 ENCOUNTER — Other Ambulatory Visit: Payer: Self-pay | Admitting: *Deleted

## 2020-07-28 DIAGNOSIS — R072 Precordial pain: Secondary | ICD-10-CM

## 2020-07-28 NOTE — Telephone Encounter (Signed)
Called pt to schedule BMET in preparation of his CT in April.  No answer and unable to leave message.  Will route to triage to try pt again tomorrow. Order for BMET has been placed.

## 2020-07-28 NOTE — Telephone Encounter (Signed)
-----   Message from [place] sent at 07/28/2020  3:06 PM EDT ----- Regarding: ct heart Scheduled 08/10/20 at 3:15    He will need labs done.  Thanks, [place]

## 2020-07-30 ENCOUNTER — Other Ambulatory Visit: Payer: Self-pay

## 2020-07-30 ENCOUNTER — Other Ambulatory Visit: Payer: Medicare Other | Admitting: *Deleted

## 2020-07-30 DIAGNOSIS — R072 Precordial pain: Secondary | ICD-10-CM | POA: Diagnosis not present

## 2020-07-30 NOTE — Telephone Encounter (Signed)
Spoke with the patient and he will come in today for BMET

## 2020-07-31 LAB — BASIC METABOLIC PANEL
BUN/Creatinine Ratio: 15 (ref 10–24)
BUN: 15 mg/dL (ref 8–27)
CO2: 25 mmol/L (ref 20–29)
Calcium: 9.5 mg/dL (ref 8.6–10.2)
Chloride: 100 mmol/L (ref 96–106)
Creatinine, Ser: 0.98 mg/dL (ref 0.76–1.27)
Glucose: 81 mg/dL (ref 65–99)
Potassium: 4.8 mmol/L (ref 3.5–5.2)
Sodium: 141 mmol/L (ref 134–144)
eGFR: 81 mL/min/{1.73_m2} (ref >59)

## 2020-08-06 DIAGNOSIS — I10 Essential (primary) hypertension: Secondary | ICD-10-CM | POA: Diagnosis not present

## 2020-08-06 DIAGNOSIS — E782 Mixed hyperlipidemia: Secondary | ICD-10-CM | POA: Diagnosis not present

## 2020-08-06 DIAGNOSIS — K219 Gastro-esophageal reflux disease without esophagitis: Secondary | ICD-10-CM | POA: Diagnosis not present

## 2020-08-06 DIAGNOSIS — G4700 Insomnia, unspecified: Secondary | ICD-10-CM | POA: Diagnosis not present

## 2020-08-06 DIAGNOSIS — M1990 Unspecified osteoarthritis, unspecified site: Secondary | ICD-10-CM | POA: Diagnosis not present

## 2020-08-06 DIAGNOSIS — M47812 Spondylosis without myelopathy or radiculopathy, cervical region: Secondary | ICD-10-CM | POA: Diagnosis not present

## 2020-08-09 ENCOUNTER — Telehealth (HOSPITAL_COMMUNITY): Payer: Self-pay | Admitting: Emergency Medicine

## 2020-08-09 NOTE — Telephone Encounter (Signed)
error 

## 2020-08-10 ENCOUNTER — Encounter (HOSPITAL_COMMUNITY): Payer: Self-pay

## 2020-08-10 ENCOUNTER — Other Ambulatory Visit: Payer: Self-pay

## 2020-08-10 ENCOUNTER — Ambulatory Visit (HOSPITAL_COMMUNITY)
Admission: RE | Admit: 2020-08-10 | Discharge: 2020-08-10 | Disposition: A | Discharge status: Home / Self Care | Payer: Medicare Other | Source: Ambulatory Visit | Attending: Cardiology | Admitting: Cardiology

## 2020-08-10 DIAGNOSIS — R072 Precordial pain: Secondary | ICD-10-CM | POA: Insufficient documentation

## 2020-08-10 MED ORDER — NITROGLYCERIN 0.4 MG SL SUBL
SUBLINGUAL_TABLET | SUBLINGUAL | Status: AC
  Filled 2020-08-10: qty 2

## 2020-08-10 MED ORDER — IOHEXOL 350 MG/ML SOLN
90.0000 mL | Freq: Once | INTRAVENOUS | Status: AC | PRN
  Administered 2020-08-10: 90 mL via INTRAVENOUS

## 2020-08-10 MED ORDER — NITROGLYCERIN 0.4 MG SL SUBL
0.8000 mg | SUBLINGUAL_TABLET | Freq: Once | SUBLINGUAL | Status: AC
  Administered 2020-08-10: 0.8 mg via SUBLINGUAL

## 2020-10-19 DIAGNOSIS — M1990 Unspecified osteoarthritis, unspecified site: Secondary | ICD-10-CM | POA: Diagnosis not present

## 2020-10-19 DIAGNOSIS — K219 Gastro-esophageal reflux disease without esophagitis: Secondary | ICD-10-CM | POA: Diagnosis not present

## 2020-10-19 DIAGNOSIS — G4700 Insomnia, unspecified: Secondary | ICD-10-CM | POA: Diagnosis not present

## 2020-10-19 DIAGNOSIS — E782 Mixed hyperlipidemia: Secondary | ICD-10-CM | POA: Diagnosis not present

## 2020-10-19 DIAGNOSIS — I10 Essential (primary) hypertension: Secondary | ICD-10-CM | POA: Diagnosis not present

## 2020-10-19 DIAGNOSIS — M47812 Spondylosis without myelopathy or radiculopathy, cervical region: Secondary | ICD-10-CM | POA: Diagnosis not present

## 2020-11-19 ENCOUNTER — Encounter: Payer: Self-pay | Admitting: Cardiology

## 2020-12-08 DIAGNOSIS — I10 Essential (primary) hypertension: Secondary | ICD-10-CM | POA: Diagnosis not present

## 2020-12-08 DIAGNOSIS — M1990 Unspecified osteoarthritis, unspecified site: Secondary | ICD-10-CM | POA: Diagnosis not present

## 2020-12-08 DIAGNOSIS — G4700 Insomnia, unspecified: Secondary | ICD-10-CM | POA: Diagnosis not present

## 2020-12-08 DIAGNOSIS — M47812 Spondylosis without myelopathy or radiculopathy, cervical region: Secondary | ICD-10-CM | POA: Diagnosis not present

## 2020-12-08 DIAGNOSIS — E782 Mixed hyperlipidemia: Secondary | ICD-10-CM | POA: Diagnosis not present

## 2020-12-08 DIAGNOSIS — K219 Gastro-esophageal reflux disease without esophagitis: Secondary | ICD-10-CM | POA: Diagnosis not present

## 2020-12-13 ENCOUNTER — Ambulatory Visit: Payer: Medicare Other | Admitting: Cardiology

## 2020-12-29 NOTE — Progress Notes (Signed)
Office Visit    Patient Name: Joseph Reilly Date of Encounter: 12/30/2020  PCP:  Lawerance Cruel, MD   [facility]  Cardiologist:  Freada Bergeron, MD  Advanced Practice Provider:  No care team member to display Electrophysiologist:  None    Chief Complaint    TEXAS HUTTNER is a 75 y.o. male with a hx of CAD, HTN, HLD, GERD presents today for CAD follow up.    Past Medical History    Past Medical History:  Diagnosis Date   Adenomatous polyp of colon    Arthritis    CAD (coronary artery disease)    GERD (gastroesophageal reflux disease)    Hemorrhoids    Hiatal hernia    small    History of gallstones    Hyperlipemia    Hypertension    IBS (irritable bowel syndrome)    Trigger finger    bilateral thumbs   Varicose veins    Past Surgical History:  Procedure Laterality Date   CARDIAC CATHETERIZATION  2011   CARDIAC CATHETERIZATION  07/14/2008   EF 60%   ENDOVENOUS ABLATION SAPHENOUS VEIN W/ LASER Right 10-22-2014   endovenous laser ablation right greater saphenous vein by Curt Jews MD   ENDOVENOUS ABLATION SAPHENOUS VEIN W/ LASER Left 11-19-2014   endovenous laser ablation (left greater saphenous vein) by Curt Jews MD   HYDROCELE EXCISION / REPAIR     LUMBAR SPINE SURGERY     TONSILLECTOMY     TRANSTHORACIC ECHOCARDIOGRAM  07/14/2008   EF 60%    Allergies  Allergies  Allergen Reactions   Lisinopril Cough   Lotensin [Benazepril Hcl]     "Allergic"    History of Present Illness    Joseph Reilly is a 75 y.o. male with a hx of CAD, HTN, HLD, GERD last seen 06/14/20.  Reported previous cardiac cath in 2010 with minor blockage, films not available. Previous stress echo 2013 without evidence of ischemia. He was then lost to follow up until 06/14/20 when evaluated by Dr. Johney Frame. He noted some dyspnea with stairs and walking. Echo ordered and performed 07/07/20 showing LVEF 55-60%, no RWMA, gr1DD, RV normal size/function, normal  PASP, trivial MR, mild dilation of aortic root 79m and mild dilation of ascnding aorta 355mSubsequent cardiac CTA 07/2020 with diffuse mild nonobstructive CAD (CADRADS2) and coronary calcium score of 545 placing him in 59th percentile for age and sex matched control.   He presents today for follow up. Very pleasant gentleman who is involved at his church and donates blood every 2 months. He reports no chest pain, pressure, tightness. He will get occasional indigestion issues. Notes occasional dyspnea with deep breath which he attributes to breaking some right ribs 1.5 years ago. [facility]. He does the machines to build strength.   EKGs/Labs/Other Studies Reviewed:   The following studies were reviewed today: Echo 07/07/20  1. Left ventricular ejection fraction, by estimation, is 55 to 60%. The  left ventricle has normal function. The left ventricle has no regional  wall motion abnormalities. Left ventricular diastolic parameters are  consistent with Grade I diastolic  dysfunction (impaired relaxation). The average left ventricular global  longitudinal strain is -22.5 %. The global longitudinal strain is normal.   2. Right ventricular systolic function is normal. The right ventricular  size is normal. There is normal pulmonary artery systolic pressure.   3. The mitral  valve is normal in structure. Trivial mitral valve  regurgitation.   4. The aortic valve is tricuspid. Aortic valve regurgitation is not  visualized. No aortic stenosis is present.   5. Aortic dilatation noted. There is mild dilatation of the aortic root,  measuring 38 mm. There is mild dilatation of the ascending aorta,  measuring 37 mm.   6. The inferior vena cava is normal in size with greater than 50%  respiratory variability, suggesting right atrial pressure of 3 mmHg.   Cardiac CTA 07/2020 FINDINGS: Coronary calcium score: The patient's coronary artery calcium score is  545, which places the patient in the 59th percentile.   Coronary arteries: Normal coronary origins.  Right dominance.   Right Coronary Artery: Normal caliber vessel, gives rise to PDA. Noncalcified ostial stenosis of 1-24% followed by mixed calcified and noncalcified plaque in the proximal RCA with 1-24% stenosis. There is scattered mixed calcified and noncalcified plaque throughout the mid and distal RCA, with greatest stenosis 1-24%   Left Main Coronary Artery: Normal caliber vessel. Minimal mixed calcified and noncalcified plaque with 1-24% stenosis.   Left Anterior Descending Coronary Artery: Normal caliber vessel. Scattered diffuse mixed calcified and noncalcified plaque throughout the LAD. There is a 25-49% stenosis in the mid LAD. Gives rise to 1 diagonal branch.   Left Circumflex Artery: Normal caliber vessel. Scattered diffuse mixed calcified and noncalcified plaque throughout the LCx. Maximum 1-25% stenosis in the mid LCX. Gives rise to 2 OM branches.   Aorta: Normal size, 36 mm at the mid ascending aorta (level of the PA bifurcation) measured double oblique. No significant calcifications. No dissection.   Aortic Valve: No significant calcifications. Trileaflet.   Other findings:   Normal pulmonary vein drainage into the left atrium.   Normal left atrial appendage without a thrombus.   Normal size of the pulmonary artery.   IMPRESSION: 1. Diffuse mild nonobstructive CAD, CADRADS = 2.   2. Coronary calcium score of 545. This was 59th percentile for age and sex matched control.   3. Normal coronary origin with right dominance.   Stress Echo 2013: Study Conclusions   - Stress ECG conclusions: The stress ECG was normal.  - Baseline: LV global systolic function was normal. The    estimated LV ejection fraction was 65%. Normal wall    motion; no LV regional wall motion abnormalities.  - Peak stress: LV global systolic function was vigorous.    Normal wall  motion; no LV regional wall motion    abnormalities.  - Impressions: Normal stress echo.  Impressions:   - Normal stress echo.    TTE 2010: LEFT VENTRICLE:   -  The left ventricle was grossly normal.   -  Left ventricular size was normal.   -  Overall left ventricular systolic function was normal.   -  Left ventricular ejection fraction was estimated to be 60 %.   -  There were no left ventricular regional wall motion         abnormalities.   -  Left ventricular wall thickness was mildly increased.    AORTIC VALVE:   -  The aortic valve was grossly normal.    AORTA:   -  The aorta was grossly normal.    LEFT ATRIUM:   -  Left atrial size was at the upper limits of normal.    RIGHT VENTRICLE:   -  Right ventricular size was normal.   -  Right ventricular systolic function was normal.   -  Right ventricular wall thickness was normal.    TRICUSPID VALVE:   -  The tricuspid valve was grossly normal.    RIGHT ATRIUM:   -  The right atrium was grossly normal.    PERICARDIUM:   -  The pericardium was grossly normal.   -  The amount of pericardial fluid appeared to be at the upper         limits of normal.   EKG:  EKG is ordered today.  The ekg ordered today demonstrates NSR 63 bpm with no acute ST/T wave changes.  Recent Labs: 07/30/2020: BUN 15; Creatinine, Ser 0.98; Potassium 4.8; Sodium 141  Recent Lipid Panel    Component Value Date/Time   CHOL  07/14/2008 0355    156        ATP III CLASSIFICATION:  <200     mg/dL   Desirable  200-239  mg/dL   Borderline High  >=240    mg/dL   High          TRIG 60 07/14/2008 0355   HDL 36 (L) 07/14/2008 0355   CHOLHDL 4.3 07/14/2008 0355   VLDL 12 07/14/2008 0355   LDLCALC (H) 07/14/2008 0355    108        Total Cholesterol/HDL:CHD Risk Coronary Heart Disease Risk Table                     Men   Women  1/2 Average Risk   3.4   3.3  Average Risk       5.0   4.4  2 X Average Risk   9.6   7.1  3 X Average Risk  23.4    11.0        Use the calculated Patient Ratio above and the CHD Risk Table to determine the patient's CHD Risk.        ATP III CLASSIFICATION (LDL):  <100     mg/dL   Optimal  100-129  mg/dL   Near or Above                    Optimal  130-159  mg/dL   Borderline  160-189  mg/dL   High  >190     mg/dL   Very High     Home Medications   Current Meds  Medication Sig   amitriptyline (ELAVIL) 25 MG tablet Take 25 mg by mouth. 1/4 tablet by mouth daily   aspirin 81 MG tablet Take 81 mg by mouth daily.   B-Complex CAPS daily.   Cholecalciferol (VITAMIN D3) 1000 UNITS CAPS Take 1 capsule by mouth 2 (two) times daily.   Coenzyme Q10 200 MG capsule Take 200 mg by mouth daily.   Glucosamine Sulfate 1000 MG CAPS Take 1 capsule by mouth 2 (two) times daily.   ibuprofen (ADVIL) 200 MG tablet as needed.   IRON-VITAMIN C PO Take 65 mcg by mouth 3 (three) times a week. Mon Wed Fri   losartan (COZAAR) 50 MG tablet One tablet by mouth once daily   Magnesium 250 MG TABS daily.   Melatonin 3 MG CAPS Take by mouth. 1/4  Tablet by mouth once daily   meloxicam (MOBIC) 15 MG tablet as needed.   Multiple Vitamin (MULTI-VITAMIN PO) Take by mouth daily.   omeprazole (PRILOSEC) 40 MG capsule Take 40 mg by mouth daily.   Plant Sterols and Stanols (CHOLESTOFF) 450 MG TABS 2 tablets in the morning and at bedtime.  rosuvastatin (CRESTOR) 5 MG tablet    sucralfate (CARAFATE) 1 g tablet Take 1 g by mouth 2 (two) times daily.     Review of Systems      All other systems reviewed and are otherwise negative except as noted above.  Physical Exam    VS:  BP 134/84   Pulse 63   Ht '5\' 11"'$  (1.803 m)   Wt 197 lb (89.4 kg)   BMI 27.48 kg/m  , BMI Body mass index is 27.48 kg/m.  Wt Readings from Last 3 Encounters:  12/30/20 197 lb (89.4 kg)  06/14/20 197 lb (89.4 kg)  09/24/15 198 lb 6.4 oz (90 kg)     GEN: Well nourished, well developed, in no acute distress. HEENT: normal. Neck: Supple, no JVD,  carotid bruits, or masses. Cardiac: RRR, no murmurs, rubs, or gallops. No clubbing, cyanosis, edema.  Radials/PT 2+ and equal bilaterally.  Respiratory:  Respirations regular and unlabored, clear to auscultation bilaterally. GI: Soft, nontender, nondistended. MS: No deformity or atrophy. Skin: Warm and dry, no rash. Neuro:  Strength and sensation are intact. Psych: Normal affect.  Assessment & Plan    CAD - Nonobstructive by CTA. GDMT includes aspirin, rosuvastatin, zetia. Heart healthy diet and regular cardiovascular exercise encouraged.    HTN - BP well controlled. Continue current antihypertensive regimen of Losartan '50mg'$  QD.  Home monitoring encouraged.   HLD, LDL oal <70 - 06/2020 LDL 89, normal liver enzymes. Continue Rosuvastatin '5mg'$  three times per week. Myalgias with previously increased dose. Add Zetia '10mg'$  QD. F/u FLP/CMP in 8 weeks. If LDL not at goal, consider increased frequency of Crestor vs PCSK9i.  Aortic dilation - Echo 07/07/20 mild dilation aortic root 85m and mild dilation of ascending arota 37m Recommended for monitoring with annual echo or CT. Plan to order at follow up.   Disposition: Follow up in 6 month(s) with Dr. PeJohney Framer APP.  Signed, CaLoel DubonnetNP 12/30/2020, 9:19 AM [place]

## 2020-12-30 ENCOUNTER — Encounter (HOSPITAL_BASED_OUTPATIENT_CLINIC_OR_DEPARTMENT_OTHER): Payer: Self-pay | Admitting: Family

## 2020-12-30 ENCOUNTER — Other Ambulatory Visit: Payer: Self-pay

## 2020-12-30 ENCOUNTER — Ambulatory Visit (INDEPENDENT_AMBULATORY_CARE_PROVIDER_SITE_OTHER): Payer: Medicare Other | Admitting: Family

## 2020-12-30 VITALS — BP 134/84 | HR 63 | Ht 71.0 in | Wt 197.0 lb

## 2020-12-30 DIAGNOSIS — K219 Gastro-esophageal reflux disease without esophagitis: Secondary | ICD-10-CM | POA: Diagnosis not present

## 2020-12-30 DIAGNOSIS — R0600 Dyspnea, unspecified: Secondary | ICD-10-CM

## 2020-12-30 DIAGNOSIS — I10 Essential (primary) hypertension: Secondary | ICD-10-CM | POA: Diagnosis not present

## 2020-12-30 DIAGNOSIS — E785 Hyperlipidemia, unspecified: Secondary | ICD-10-CM | POA: Diagnosis not present

## 2020-12-30 DIAGNOSIS — I25118 Atherosclerotic heart disease of native coronary artery with other forms of angina pectoris: Secondary | ICD-10-CM

## 2020-12-30 DIAGNOSIS — R0609 Other forms of dyspnea: Secondary | ICD-10-CM

## 2020-12-30 MED ORDER — EZETIMIBE 10 MG PO TABS: 10.0000 mg | ORAL_TABLET | Freq: Every day | ORAL | 1 refills | Status: DC

## 2020-12-30 NOTE — Patient Instructions (Addendum)
Medication Instructions:  Your physician has recommended you make the following change in your medication:   START Ezetimibe (Zetia) one '10mg'$  tablet daily   *This is to help get your LDL which is your bad cholesterol to goal of less than 70. It was 83 when checked most recently*  *If you need a refill on your cardiac medications before your next appointment, please call your pharmacy*   Lab Work: Your physician recommends that you return for lab work in 2 months for fasting lipid panel at [employer] [facility].   If you have labs (blood work) drawn today and your tests are completely normal, you will receive your results only by: [employer] (if you have [place]) OR A paper copy in the mail If you have any lab test that is abnormal or we need to change your treatment, we will call you to review the results.   Testing/Procedures: Your EKG today showed normal sinus rhythm which is a great result!   Follow-Up: At [facility], you and your health needs are our priority.  As part of our continuing mission to provide you with exceptional heart care, we have created designated Provider Care Teams.  These Care Teams include your primary Cardiologist (physician) and Advanced Practice Providers (APPs -  Physician Assistants and Nurse Practitioners) who all work together to provide you with the care you need, when you need it.  We recommend signing up for the patient portal called "MyChart".  Sign up information is provided on this After Visit Summary.  MyChart is used to connect with patients for Virtual Visits (Telemedicine).  Patients are able to view lab/test results, encounter notes, upcoming appointments, etc.  Non-urgent messages can be sent to your provider as well.   To learn more about what you can do with MyChart, go to [email or web address].    Your next appointment:   March 2023 with Dr. Johney Frame   Other Instructions  Heart Healthy Diet Recommendations: A low-salt diet is  recommended. Meats should be grilled, baked, or boiled. Avoid fried foods. Focus on lean protein sources like fish or chicken with vegetables and fruits. The American Heart Association is a Microbiologist!    Exercise recommendations: The American Heart Association recommends 150 minutes of moderate intensity exercise weekly. Try 30 minutes of moderate intensity exercise 4-5 times per week. This could include walking, jogging, or swimming.  Ezetimibe Tablets What is this medication? EZETIMIBE (ez ET i mibe) treats high cholesterol. It works by reducing the amount of cholesterol absorbed from the food you eat. This decreases the amount of bad cholesterol (such as LDL) in your blood. Changes to diet and exercise are often combined with this medication. This medicine may be used for other purposes; ask your health care provider or pharmacist if you have questions. COMMON BRAND NAME(S): Zetia How should I use this medication? Take this medication by mouth. Take it as directed on the prescription label at the same time every day. You can take it with or without food. If it upsets your stomach, take it with food. Keep taking it unless your care team tells you to stop. Take bile acid sequestrants at a different time of day than this medication. Take this medication 2 hours BEFORE or 4 hours AFTER bile acid sequestrants. Talk to your care team about the use of this medication in children. While it may be prescribed for children as young as 10 for selected conditions, precautions do apply. Overdosage: If you think you have taken too much  of this medicine contact a poison control center or emergency room at once. NOTE: This medicine is only for you. Do not share this medicine with others. What if I miss a dose? If you miss a dose, take it as soon as you can. If it is almost time for your next dose, take only that dose. Do not take double or extra doses. What may interact with this medication? Do not take  this medication with any of the following: Fenofibrate Gemfibrozil This medication may also interact with the following: Antacids Cyclosporine Herbal medications like red yeast rice Other medications to lower cholesterol or triglycerides This list may not describe all possible interactions. Give your health care provider a list of all the medicines, herbs, non-prescription drugs, or dietary supplements you use. Also tell them if you smoke, drink alcohol, or use illegal drugs. Some items may interact with your medicine. What should I watch for while using this medication? Visit your care team for regular checks on your progress. Tell your care team if your symptoms do not start to get better or if they get worse. Your care team may tell you to stop taking this medication if you develop muscle problems. If your muscle problems do not go away after stopping this medication, contact your care team. Do not become pregnant while taking this medication. Women should inform their care team if they wish to become pregnant or think they might be pregnant. There is potential for serious harm to an unborn child. Talk to your care team for more information. Do not breast-feed an infant while taking this medication. Taking this medication is only part of a total heart healthy program. Your care team may give you a special diet to follow. Avoid alcohol. Avoid smoking. Ask your care team how much you should exercise. What side effects may I notice from receiving this medication? Side effects that you should report to your doctor or health care provider as soon as possible: Allergic reactions-skin rash, itching or hives, swelling of the face, lips, tongue, or throat Side effects that usually do not require medical attention (report to your doctor or health care provider if they continue or are bothersome): Diarrhea Joint pain This list may not describe all possible side effects. Call your doctor for medical  advice about side effects. You may report side effects to FDA at 1-800-FDA-1088. Where should I keep my medication? Keep out of the reach of children and pets. Store at room temperature between 15 and 30 degrees C (59 and 86 degrees F). Protect from moisture. Get rid of any unused medication after the expiration date. NOTE: This sheet is a summary. It may not cover all possible information. If you have questions about this medicine, talk to your doctor, pharmacist, or health care provider.  2022 Elsevier/Gold Standard (2020-05-14 12:29:13)

## 2021-02-03 DIAGNOSIS — M47812 Spondylosis without myelopathy or radiculopathy, cervical region: Secondary | ICD-10-CM | POA: Diagnosis not present

## 2021-02-03 DIAGNOSIS — K219 Gastro-esophageal reflux disease without esophagitis: Secondary | ICD-10-CM | POA: Diagnosis not present

## 2021-02-03 DIAGNOSIS — M1990 Unspecified osteoarthritis, unspecified site: Secondary | ICD-10-CM | POA: Diagnosis not present

## 2021-02-03 DIAGNOSIS — E782 Mixed hyperlipidemia: Secondary | ICD-10-CM | POA: Diagnosis not present

## 2021-02-03 DIAGNOSIS — I10 Essential (primary) hypertension: Secondary | ICD-10-CM | POA: Diagnosis not present

## 2021-02-03 DIAGNOSIS — G4700 Insomnia, unspecified: Secondary | ICD-10-CM | POA: Diagnosis not present

## 2021-03-01 DIAGNOSIS — E785 Hyperlipidemia, unspecified: Secondary | ICD-10-CM | POA: Diagnosis not present

## 2021-03-02 LAB — COMPREHENSIVE METABOLIC PANEL
ALT: 26 IU/L (ref 0–44)
AST: 23 IU/L (ref 0–40)
Albumin/Globulin Ratio: 1.7 (ref 1.2–2.2)
Albumin: 4.5 g/dL (ref 3.7–4.7)
Alkaline Phosphatase: 105 IU/L (ref 44–121)
BUN/Creatinine Ratio: 16 (ref 10–24)
BUN: 16 mg/dL (ref 8–27)
Bilirubin Total: 0.8 mg/dL (ref 0.0–1.2)
CO2: 26 mmol/L (ref 20–29)
Calcium: 9.4 mg/dL (ref 8.6–10.2)
Chloride: 101 mmol/L (ref 96–106)
Creatinine, Ser: 0.98 mg/dL (ref 0.76–1.27)
Globulin, Total: 2.6 g/dL (ref 1.5–4.5)
Glucose: 97 mg/dL (ref 70–99)
Potassium: 4.9 mmol/L (ref 3.5–5.2)
Sodium: 142 mmol/L (ref 134–144)
Total Protein: 7.1 g/dL (ref 6.0–8.5)
eGFR: 81 mL/min/{1.73_m2} (ref >59)

## 2021-03-02 LAB — LIPID PANEL
Chol/HDL Ratio: 4.5 ratio (ref 0.0–5.0)
Cholesterol, Total: 167 mg/dL (ref 100–199)
HDL: 37 mg/dL — ABNORMAL LOW (ref >39)
LDL Chol Calc (NIH): 92 mg/dL (ref 0–99)
Triglycerides: 227 mg/dL — ABNORMAL HIGH (ref 0–149)
VLDL Cholesterol Cal: 38 mg/dL (ref 5–40)

## 2021-03-07 ENCOUNTER — Telehealth: Payer: Self-pay | Admitting: Cardiology

## 2021-03-07 DIAGNOSIS — E782 Mixed hyperlipidemia: Secondary | ICD-10-CM

## 2021-03-07 DIAGNOSIS — I25118 Atherosclerotic heart disease of native coronary artery with other forms of angina pectoris: Secondary | ICD-10-CM

## 2021-03-07 DIAGNOSIS — Z79899 Other long term (current) drug therapy: Secondary | ICD-10-CM

## 2021-03-07 DIAGNOSIS — E785 Hyperlipidemia, unspecified: Secondary | ICD-10-CM

## 2021-03-07 MED ORDER — ROSUVASTATIN CALCIUM 10 MG PO TABS: 10.0000 mg | ORAL_TABLET | Freq: Every day | ORAL | 1 refills | Status: AC

## 2021-03-07 NOTE — Telephone Encounter (Signed)
Deberah Pelton, NP  03/02/2021  6:20 AM EDT     Please contact Joseph Reilly and let him know that his lab work from yesterday has been reviewed.  His triglycerides are elevated at 227, his HDL is slightly low at 37, and his LDL is above goal at 92.  We will increase his rosuvastatin to 10 mg daily and repeat his fasting lipids and LFTs in 4 to 6 weeks.  Please ask him to increase the fiber in his diet and increase his physical activity.  Thank you.    Pt made aware of lab results and recommendations per [name] NP. Informed the pt that Joseph Reilly recommends that he increase his rosuvastatin to 10 mg po daily and come in for repeat lipids and LFTs in 4-6 weeks. Confirmed the pharmacy of choice with the pt.  Scheduled the pt to come to [address] office for repeat lipids and LFTs in 4-6 weeks on 04/04/21.  He is aware to come fasting.  Educated and endorsed to the pt that Joseph Reilly recommends that he increase the fiber in his diet, and increase his physical activity level. Pt verbalized understanding and agrees with this plan.

## 2021-03-07 NOTE — Telephone Encounter (Signed)
Patient was returning call for results. Please advise °

## 2021-03-09 DIAGNOSIS — N401 Enlarged prostate with lower urinary tract symptoms: Secondary | ICD-10-CM | POA: Diagnosis not present

## 2021-03-16 DIAGNOSIS — N401 Enlarged prostate with lower urinary tract symptoms: Secondary | ICD-10-CM | POA: Diagnosis not present

## 2021-03-16 DIAGNOSIS — R351 Nocturia: Secondary | ICD-10-CM | POA: Diagnosis not present

## 2021-03-28 DIAGNOSIS — R509 Fever, unspecified: Secondary | ICD-10-CM | POA: Diagnosis not present

## 2021-03-28 DIAGNOSIS — U071 COVID-19: Secondary | ICD-10-CM | POA: Diagnosis not present

## 2021-03-28 DIAGNOSIS — R059 Cough, unspecified: Secondary | ICD-10-CM | POA: Diagnosis not present

## 2021-03-30 ENCOUNTER — Telehealth: Payer: Self-pay | Admitting: Cardiology

## 2021-03-30 ENCOUNTER — Ambulatory Visit (INDEPENDENT_AMBULATORY_CARE_PROVIDER_SITE_OTHER): Payer: Medicare Other

## 2021-03-30 ENCOUNTER — Other Ambulatory Visit (HOSPITAL_COMMUNITY): Payer: Self-pay

## 2021-03-30 ENCOUNTER — Other Ambulatory Visit: Payer: Self-pay

## 2021-03-30 DIAGNOSIS — U071 COVID-19: Secondary | ICD-10-CM | POA: Diagnosis not present

## 2021-03-30 MED ORDER — DIPHENHYDRAMINE HCL 50 MG/ML IJ SOLN: 50.0000 mg | Freq: Once | INTRAMUSCULAR | Status: AC | PRN

## 2021-03-30 MED ORDER — METHYLPREDNISOLONE SODIUM SUCC 125 MG IJ SOLR: 125.0000 mg | Freq: Once | INTRAMUSCULAR | Status: AC | PRN

## 2021-03-30 MED ORDER — BEBTELOVIMAB 175 MG/2 ML IV (EUA)
175.0000 mg | Freq: Once | INTRAMUSCULAR | Status: AC
  Administered 2021-03-30: 175 mg via INTRAVENOUS

## 2021-03-30 MED ORDER — FAMOTIDINE IN NACL 20-0.9 MG/50ML-% IV SOLN: 20.0000 mg | Freq: Once | INTRAVENOUS | Status: AC | PRN

## 2021-03-30 MED ORDER — ALBUTEROL SULFATE HFA 108 (90 BASE) MCG/ACT IN AERS: 2.0000 | INHALATION_SPRAY | Freq: Once | RESPIRATORY_TRACT | Status: AC | PRN

## 2021-03-30 MED ORDER — SODIUM CHLORIDE 0.9 % IV SOLN: INTRAVENOUS | Status: AC | PRN

## 2021-03-30 MED ORDER — EPINEPHRINE 0.3 MG/0.3ML IJ SOAJ: 0.3000 mg | Freq: Once | INTRAMUSCULAR | Status: AC | PRN

## 2021-03-30 NOTE — Patient Instructions (Addendum)
10 Things You Can Do to Manage Your COVID-19 Symptoms at Home If you have possible or confirmed COVID-19 Stay home except to get medical care. Monitor your symptoms carefully. If your symptoms get worse, call your healthcare provider immediately. Get rest and stay hydrated. If you have a medical appointment, call the healthcare provider ahead of time and tell them that you have or may have COVID-19. For medical emergencies, call 911 and notify the dispatch personnel that you have or may have COVID-19. Cover your cough and sneezes with a tissue or use the inside of your elbow. Wash your hands often with soap and water for at least 20 seconds or clean your hands with an alcohol-based hand sanitizer that contains at least 60% alcohol. As much as possible, stay in a specific room and away from other people in your home. Also, you should use a separate bathroom, if available. If you need to be around other people in or outside of the home, wear a mask. Avoid sharing personal items with other people in your household, like dishes, towels, and bedding. Clean all surfaces that are touched often, like counters, tabletops, and doorknobs. Use household cleaning sprays or wipes according to the label instructions. [email or web address] 11/14/2019 This information is not intended to replace advice given to you by your health care provider. Make sure you discuss any questions you have with your health care provider. Document Revised: 01/07/2021 Document Reviewed: 01/07/2021 Elsevier Patient Education  2022 [employer]. Patient was given drug information sheet for [employer].  Also given cost estimate sheet for Bebtelovimab.  Patient reviewed documentation and questions were answered.  Patient would like to proceed with treatment at this time.   1

## 2021-03-30 NOTE — Progress Notes (Signed)
Diagnosis: COVID  Provider:  Marshell Garfinkel, MD  Procedure: Infusion  IV Type: Peripheral, IV Location: L Hand  Bebtelovimab, Dose: 175 mg  Infusion Start Time: 15.40 03/30/2021  Infusion Stop Time: 15.41 03/30/2021  Post Infusion IV Care: Observation period completed and Peripheral IV Discontinued  Discharge: Condition: Good, Destination: Home . AVS provided to patient.   Performed by:  Arnoldo Morale, RN

## 2021-03-30 NOTE — Telephone Encounter (Signed)
Patient said his medications have changed recently. HE would like to talk to [facility], DR. Pemberton's Nurse to make sure he is taking all of his medications properly.

## 2021-03-30 NOTE — Telephone Encounter (Signed)
I spoke with the pt and went over his med list..he is having an infusion today for his COVID and needed to be sure it was updated.

## 2021-04-04 ENCOUNTER — Other Ambulatory Visit: Payer: Medicare Other

## 2021-04-08 DIAGNOSIS — R0982 Postnasal drip: Secondary | ICD-10-CM | POA: Diagnosis not present

## 2021-04-08 DIAGNOSIS — U071 COVID-19: Secondary | ICD-10-CM | POA: Diagnosis not present

## 2021-04-19 ENCOUNTER — Other Ambulatory Visit: Payer: Medicare Other

## 2021-04-19 ENCOUNTER — Other Ambulatory Visit: Payer: Self-pay

## 2021-04-19 DIAGNOSIS — I25118 Atherosclerotic heart disease of native coronary artery with other forms of angina pectoris: Secondary | ICD-10-CM | POA: Diagnosis not present

## 2021-04-19 DIAGNOSIS — E785 Hyperlipidemia, unspecified: Secondary | ICD-10-CM

## 2021-04-19 DIAGNOSIS — E782 Mixed hyperlipidemia: Secondary | ICD-10-CM

## 2021-04-19 DIAGNOSIS — Z79899 Other long term (current) drug therapy: Secondary | ICD-10-CM

## 2021-04-20 LAB — LIPID PANEL
Chol/HDL Ratio: 3.3 ratio (ref 0.0–5.0)
Cholesterol, Total: 117 mg/dL (ref 100–199)
HDL: 36 mg/dL — ABNORMAL LOW (ref >39)
LDL Chol Calc (NIH): 56 mg/dL (ref 0–99)
Triglycerides: 140 mg/dL (ref 0–149)
VLDL Cholesterol Cal: 25 mg/dL (ref 5–40)

## 2021-04-20 LAB — HEPATIC FUNCTION PANEL
ALT: 28 IU/L (ref 0–44)
AST: 28 IU/L (ref 0–40)
Albumin: 4.2 g/dL (ref 3.7–4.7)
Alkaline Phosphatase: 90 IU/L (ref 44–121)
Bilirubin Total: 0.8 mg/dL (ref 0.0–1.2)
Bilirubin, Direct: 0.25 mg/dL (ref 0.00–0.40)
Total Protein: 7.2 g/dL (ref 6.0–8.5)

## 2021-06-06 DIAGNOSIS — Z1389 Encounter for screening for other disorder: Secondary | ICD-10-CM | POA: Diagnosis not present

## 2021-06-06 DIAGNOSIS — Z0000 Encounter for general adult medical examination without abnormal findings: Secondary | ICD-10-CM | POA: Diagnosis not present

## 2021-06-15 DIAGNOSIS — I10 Essential (primary) hypertension: Secondary | ICD-10-CM | POA: Diagnosis not present

## 2021-06-15 DIAGNOSIS — E559 Vitamin D deficiency, unspecified: Secondary | ICD-10-CM | POA: Diagnosis not present

## 2021-06-15 DIAGNOSIS — E782 Mixed hyperlipidemia: Secondary | ICD-10-CM | POA: Diagnosis not present

## 2021-06-21 DIAGNOSIS — E611 Iron deficiency: Secondary | ICD-10-CM | POA: Diagnosis not present

## 2021-06-21 DIAGNOSIS — E559 Vitamin D deficiency, unspecified: Secondary | ICD-10-CM | POA: Diagnosis not present

## 2021-06-21 DIAGNOSIS — I700 Atherosclerosis of aorta: Secondary | ICD-10-CM | POA: Diagnosis not present

## 2021-06-21 DIAGNOSIS — K219 Gastro-esophageal reflux disease without esophagitis: Secondary | ICD-10-CM | POA: Diagnosis not present

## 2021-06-21 DIAGNOSIS — E782 Mixed hyperlipidemia: Secondary | ICD-10-CM | POA: Diagnosis not present

## 2021-06-21 DIAGNOSIS — R413 Other amnesia: Secondary | ICD-10-CM | POA: Diagnosis not present

## 2021-06-21 DIAGNOSIS — I10 Essential (primary) hypertension: Secondary | ICD-10-CM | POA: Diagnosis not present

## 2021-06-22 ENCOUNTER — Other Ambulatory Visit (HOSPITAL_BASED_OUTPATIENT_CLINIC_OR_DEPARTMENT_OTHER): Payer: Self-pay | Admitting: Family

## 2021-06-22 DIAGNOSIS — E785 Hyperlipidemia, unspecified: Secondary | ICD-10-CM

## 2021-07-07 NOTE — Progress Notes (Unsigned)
Cardiology Office Note:    Date:  07/07/2021   ID:  Joseph Reilly, DOB 1945/06/08, MRN [id number]  PCP:  Lawerance Cruel, MD   [facility]  Cardiologist:  Freada Bergeron, MD  Advanced Practice Provider:  No care team member to display Electrophysiologist:  None   Referring MD: Lawerance Cruel, MD    History of Present Illness:    Joseph Reilly is a 76 y.o. male with a hx of CAD, HTN, HLD and GERD who was referred by Dr. Harrington Challenger for further evaluation for dyspnea on exertion  Last saw Dr. [place]ique in 2013 for epigastric discomfort. Stress echo 2013 normal.   Patient states that when he walks a flight of stairs he feels more dyspneic. Also notes it when walking faster, he feels more short winded. No chest discomfort when this is occurring. No LE edema, orthopnea, PND, nausea or vomiting. Has occasional palpitations. No lightheadedness or dizziness. No syncope. Blood pressure is well controlled at home.   Admits that he is not as active as he used to be.  Reported history of cath in 2010 with some minor blockage. Films not available.    Also has a nagging sensation that migrates across the chest and back while at rest. These symptoms are not exertional  Past Medical History:  Diagnosis Date   Adenomatous polyp of colon    Arthritis    CAD (coronary artery disease)    GERD (gastroesophageal reflux disease)    Hemorrhoids    Hiatal hernia    small    History of gallstones    Hyperlipemia    Hypertension    IBS (irritable bowel syndrome)    Trigger finger    bilateral thumbs   Varicose veins     Past Surgical History:  Procedure Laterality Date   CARDIAC CATHETERIZATION  2011   CARDIAC CATHETERIZATION  07/14/2008   EF 60%   ENDOVENOUS ABLATION SAPHENOUS VEIN W/ LASER Right 10-22-2014   endovenous laser ablation right greater saphenous vein by Curt Jews MD   ENDOVENOUS ABLATION SAPHENOUS VEIN W/ LASER Left 11-19-2014   endovenous laser  ablation (left greater saphenous vein) by Curt Jews MD   HYDROCELE EXCISION / [place] ECHOCARDIOGRAM  07/14/2008   EF 60%    Current Medications: No outpatient medications have been marked as taking for the 07/15/21 encounter (Appointment) with Freada Bergeron, MD.   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[employer], mining  Tobacco Use   Smoking status: Never   Smokeless tobacco: Never  Substance and Sexual Activity   Alcohol use: No    Alcohol/week: 0.0 standard drinks   Drug use: No   Sexual activity: Not on file  Other Topics Concern   Not on file  Social History Narrative   Caffeine drink daily    Social Determinants of Health   Financial Resource Strain: Not on  file  Food Insecurity: Not on file  Transportation Needs: Not on file  Physical Activity: Not on file  Stress: Not on file  Social Connections: Not on file     Family History: The patient's family history includes Diabetes (age of onset: 42) in his father; Heart disease in his mother. There is no history of Colon cancer.  ROS:   Please see the history of present illness.    Review of Systems  Constitutional:  Negative for chills, fever and malaise/fatigue.  HENT:  Negative for nosebleeds.   Eyes:  Negative for blurred vision and redness.  Respiratory:  Positive for shortness of breath.   Cardiovascular:  Negative for chest pain, palpitations, orthopnea, claudication, leg swelling and PND.  Gastrointestinal:   Negative for melena, nausea and vomiting.  Genitourinary:  Negative for hematuria.  Musculoskeletal:  Positive for myalgias. Negative for falls.  Neurological:  Negative for dizziness and loss of consciousness.  Endo/Heme/Allergies:  Negative for polydipsia.  Psychiatric/Behavioral:  Negative for substance abuse.    EKGs/Labs/Other Studies Reviewed:    The following studies were reviewed today: Stress Echo 2013: Study Conclusions   - Stress ECG conclusions: The stress ECG was normal.  - Baseline: LV global systolic function was normal. The    estimated LV ejection fraction was 65%. Normal wall    motion; no LV regional wall motion abnormalities.  - Peak stress: LV global systolic function was vigorous.    Normal wall motion; no LV regional wall motion    abnormalities.  - Impressions: Normal stress echo.  Impressions:   - Normal stress echo.   TTE 2010: LEFT VENTRICLE:   -  The left ventricle was grossly normal.   -  Left ventricular size was normal.   -  Overall left ventricular systolic function was normal.   -  Left ventricular ejection fraction was estimated to be 60 %.   -  There were no left ventricular regional wall motion         abnormalities.   -  Left ventricular wall thickness was mildly increased.    AORTIC VALVE:   -  The aortic valve was grossly normal.    AORTA:   -  The aorta was grossly normal.    LEFT ATRIUM:   -  Left atrial size was at the upper limits of normal.    RIGHT VENTRICLE:   -  Right ventricular size was normal.   -  Right ventricular systolic function was normal.   -  Right ventricular wall thickness was normal.    TRICUSPID VALVE:   -  The tricuspid valve was grossly normal.    RIGHT ATRIUM:   -  The right atrium was grossly normal.    PERICARDIUM:   -  The pericardium was grossly normal.   -  The amount of pericardial fluid appeared to be at the upper         limits of normal.    EKG:  EKG is  ordered today.  The ekg  ordered today demonstrates NSR with HR 63  Recent Labs: 03/01/2021: BUN 16; Creatinine, Ser 0.98; Potassium 4.9; Sodium 142 04/19/2021: ALT 28  Recent Lipid Panel    Component Value Date/Time   CHOL 117 04/19/2021 0947   TRIG 140 04/19/2021 0947   HDL 36 (L) 04/19/2021 0947   CHOLHDL 3.3 04/19/2021 0947   CHOLHDL 4.3 07/14/2008 0355   VLDL 12 07/14/2008 0355   LDLCALC 56 04/19/2021 0947  Physical Exam:    VS:  There were no vitals taken for this visit.    Wt Readings from Last 3 Encounters:  12/30/20 197 lb (89.4 kg)  06/14/20 197 lb (89.4 kg)  09/24/15 198 lb 6.4 oz (90 kg)     GEN:  Well nourished, well developed in no acute distress HEENT: Normal NECK: No JVD; No carotid bruits CARDIAC: RRR, no murmurs, rubs, gallops RESPIRATORY:  Clear to auscultation without rales, wheezing or rhonchi  ABDOMEN: Soft, non-tender, non-distended MUSCULOSKELETAL:  No edema; No deformity  SKIN: Warm and dry NEUROLOGIC:  Alert and oriented x 3 PSYCHIATRIC:  Normal affect   ASSESSMENT:    No diagnosis found.  PLAN:    In order of problems listed above:  #Dyspnea on exertion: Patient noted worsening dyspnea on exertion when walking stairs or walking at a faster pace. Has been getting worse over the past several months. Has occasional migrating chest discomfort but this can occur at rest and does not correlate with exertion. Prior stress in 2013 normal but has not followed up since. Has several risk factors for CAD including HTN, HLD, and prior reported nonobstructive CAD on cath at OSH in 2010. -Coronary CTA -TTE -Continue ASA '81mg'$ , crestor '5mg'$  daily -Continue iron supplementation for low iron levels on recent labs  #HTN: Well controlled at home. -Continue losartan '50mg'$  daily  #HLD: -Crestor '5mg'$  daily    Medication Adjustments/Labs and Tests Ordered: Current medicines are reviewed at length with the patient today.  Concerns regarding medicines are outlined above.  No  orders of the defined types were placed in this encounter.  No orders of the defined types were placed in this encounter.   There are no Patient Instructions on file for this visit.    Signed, Freada Bergeron, MD  07/07/2021 3:40 PM    [facility]

## 2021-07-15 ENCOUNTER — Encounter: Payer: Self-pay | Admitting: Cardiology

## 2021-07-15 ENCOUNTER — Ambulatory Visit (INDEPENDENT_AMBULATORY_CARE_PROVIDER_SITE_OTHER): Payer: Medicare Other | Admitting: Cardiology

## 2021-07-15 ENCOUNTER — Other Ambulatory Visit: Payer: Self-pay

## 2021-07-15 VITALS — BP 116/68 | HR 52 | Ht 71.0 in | Wt 197.0 lb

## 2021-07-15 DIAGNOSIS — I10 Essential (primary) hypertension: Secondary | ICD-10-CM | POA: Diagnosis not present

## 2021-07-15 DIAGNOSIS — I77810 Thoracic aortic ectasia: Secondary | ICD-10-CM

## 2021-07-15 DIAGNOSIS — E782 Mixed hyperlipidemia: Secondary | ICD-10-CM | POA: Diagnosis not present

## 2021-07-15 DIAGNOSIS — E785 Hyperlipidemia, unspecified: Secondary | ICD-10-CM | POA: Diagnosis not present

## 2021-07-15 DIAGNOSIS — I25118 Atherosclerotic heart disease of native coronary artery with other forms of angina pectoris: Secondary | ICD-10-CM

## 2021-07-15 NOTE — Patient Instructions (Signed)
Medication Instructions:  ? ?Your physician recommends that you continue on your current medications as directed. Please refer to the Current Medication list given to you today. ? ?*If you need a refill on your cardiac medications before your next appointment, please call your pharmacy* ? ? ?Follow-Up: ?At [facility], you and your health needs are our priority.  As part of our continuing mission to provide you with exceptional heart care, we have created designated Provider Care Teams.  These Care Teams include your primary Cardiologist (physician) and Advanced Practice Providers (APPs -  Physician Assistants and Nurse Practitioners) who all work together to provide you with the care you need, when you need it. ? ?We recommend signing up for the patient portal called "MyChart".  Sign up information is provided on this After Visit Summary.  MyChart is used to connect with patients for Virtual Visits (Telemedicine).  Patients are able to view lab/test results, encounter notes, upcoming appointments, etc.  Non-urgent messages can be sent to your provider as well.   ?To learn more about what you can do with MyChart, go to [email or web address].   ? ?Your next appointment:   ?1 year(s) ? ?The format for your next appointment:   ?In Person ? ?Provider:   ?Freada Bergeron, MD { ? ? ? ?

## 2021-07-18 DIAGNOSIS — H5203 Hypermetropia, bilateral: Secondary | ICD-10-CM | POA: Diagnosis not present

## 2021-07-18 DIAGNOSIS — H2513 Age-related nuclear cataract, bilateral: Secondary | ICD-10-CM | POA: Diagnosis not present

## 2021-07-18 DIAGNOSIS — H524 Presbyopia: Secondary | ICD-10-CM | POA: Diagnosis not present

## 2021-08-25 DIAGNOSIS — J069 Acute upper respiratory infection, unspecified: Secondary | ICD-10-CM | POA: Diagnosis not present

## 2021-09-02 DIAGNOSIS — M25562 Pain in left knee: Secondary | ICD-10-CM | POA: Diagnosis not present

## 2021-09-05 DIAGNOSIS — M25552 Pain in left hip: Secondary | ICD-10-CM | POA: Diagnosis not present

## 2021-09-05 DIAGNOSIS — M4726 Other spondylosis with radiculopathy, lumbar region: Secondary | ICD-10-CM | POA: Diagnosis not present

## 2021-09-05 DIAGNOSIS — M25562 Pain in left knee: Secondary | ICD-10-CM | POA: Diagnosis not present

## 2021-09-09 DIAGNOSIS — M5416 Radiculopathy, lumbar region: Secondary | ICD-10-CM | POA: Diagnosis not present

## 2021-09-09 DIAGNOSIS — M6281 Muscle weakness (generalized): Secondary | ICD-10-CM | POA: Diagnosis not present

## 2021-09-14 DIAGNOSIS — M5416 Radiculopathy, lumbar region: Secondary | ICD-10-CM | POA: Diagnosis not present

## 2021-09-14 DIAGNOSIS — M6281 Muscle weakness (generalized): Secondary | ICD-10-CM | POA: Diagnosis not present

## 2021-09-16 DIAGNOSIS — M6281 Muscle weakness (generalized): Secondary | ICD-10-CM | POA: Diagnosis not present

## 2021-09-16 DIAGNOSIS — M5416 Radiculopathy, lumbar region: Secondary | ICD-10-CM | POA: Diagnosis not present

## 2021-09-19 DIAGNOSIS — M6281 Muscle weakness (generalized): Secondary | ICD-10-CM | POA: Diagnosis not present

## 2021-09-19 DIAGNOSIS — M5416 Radiculopathy, lumbar region: Secondary | ICD-10-CM | POA: Diagnosis not present

## 2021-09-21 DIAGNOSIS — M5416 Radiculopathy, lumbar region: Secondary | ICD-10-CM | POA: Diagnosis not present

## 2021-09-21 DIAGNOSIS — M6281 Muscle weakness (generalized): Secondary | ICD-10-CM | POA: Diagnosis not present

## 2021-09-28 DIAGNOSIS — M6281 Muscle weakness (generalized): Secondary | ICD-10-CM | POA: Diagnosis not present

## 2021-09-28 DIAGNOSIS — M5416 Radiculopathy, lumbar region: Secondary | ICD-10-CM | POA: Diagnosis not present

## 2021-09-28 DIAGNOSIS — M4726 Other spondylosis with radiculopathy, lumbar region: Secondary | ICD-10-CM | POA: Diagnosis not present

## 2021-09-30 DIAGNOSIS — M6281 Muscle weakness (generalized): Secondary | ICD-10-CM | POA: Diagnosis not present

## 2021-09-30 DIAGNOSIS — M5416 Radiculopathy, lumbar region: Secondary | ICD-10-CM | POA: Diagnosis not present

## 2021-10-03 DIAGNOSIS — M6281 Muscle weakness (generalized): Secondary | ICD-10-CM | POA: Diagnosis not present

## 2021-10-03 DIAGNOSIS — M5416 Radiculopathy, lumbar region: Secondary | ICD-10-CM | POA: Diagnosis not present

## 2021-10-05 DIAGNOSIS — M5416 Radiculopathy, lumbar region: Secondary | ICD-10-CM | POA: Diagnosis not present

## 2021-10-05 DIAGNOSIS — M6281 Muscle weakness (generalized): Secondary | ICD-10-CM | POA: Diagnosis not present

## 2021-10-07 DIAGNOSIS — M5416 Radiculopathy, lumbar region: Secondary | ICD-10-CM | POA: Diagnosis not present

## 2021-10-07 DIAGNOSIS — M6281 Muscle weakness (generalized): Secondary | ICD-10-CM | POA: Diagnosis not present

## 2021-10-08 IMAGING — CT CT HEART MORP W/ CTA COR W/ SCORE W/ CA W/CM &/OR W/O CM
4 of 7 series · 8 of 20 positions shown, 9 images · IV contrast (APPLIED)
Comparison: Prior CTA of the chest on 07/13/2008
COMPARISON: Prior CTA of the chest on 07/13/2008

Addendum:
EXAM:
OVER-READ INTERPRETATION  CT CHEST

The following report is an over-read performed by radiologist Dr.
Desile Sarai [REDACTED] on 08/10/2020. This
over-read does not include interpretation of cardiac or coronary
anatomy or pathology. The coronary CTA interpretation by the
cardiologist is attached.
HISTORY: Chest pain, nonspecific
Cardiac/Coronary CT
TECHNIQUE: The patient was scanned on a Siemens Force scanner.
PROTOCOL: A 100 kV prospective scan was triggered in the descending thoracic
aorta at 111 HU's. Axial non-contrast 3 mm slices were carried out
through the heart. The data set was analyzed on a dedicated work
station and scored using the Agatson method. Gantry rotation speed
was 250 msecs and collimation was 0.6 mm. Heart rate optimized
medically, and 0.8 mg of sublingual nitroglycerin was given. The 3D
data set was reconstructed in 5% intervals of 35-75% of the R-R
cycle. Diastolic phases were analyzed on a dedicated work station
using MPR, MIP and VRT modes. The patient received 90mL OMNIPAQUE
IOHEXOL 350 MG/ML SOLN of contrast.

[Series 6: best diast 73 % · axial · 0.39mm/px · z∈[-324,-280]mm · 2 of 331 slices shown]
[im 111/331  vessel]
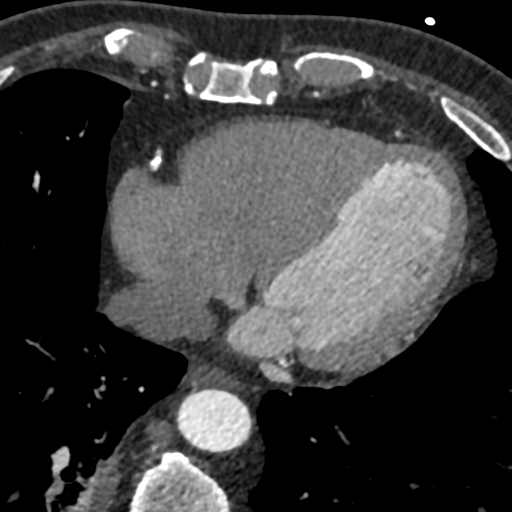
[im 221/331  vessel]
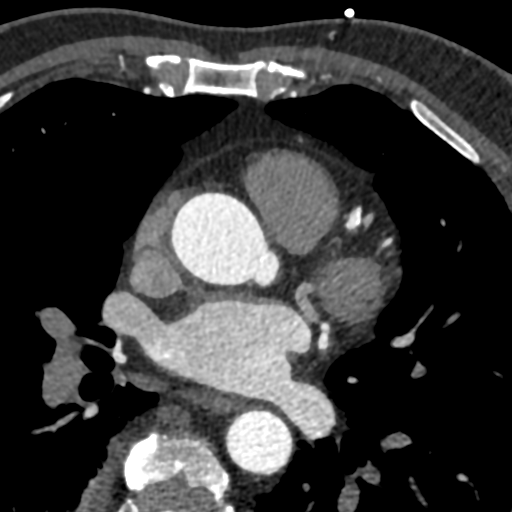

[Series 8: best syst · axial · 0.39mm/px · z∈[-324,-280]mm · 2 of 331 slices shown, 3 images]
[im 111/331  vessel]
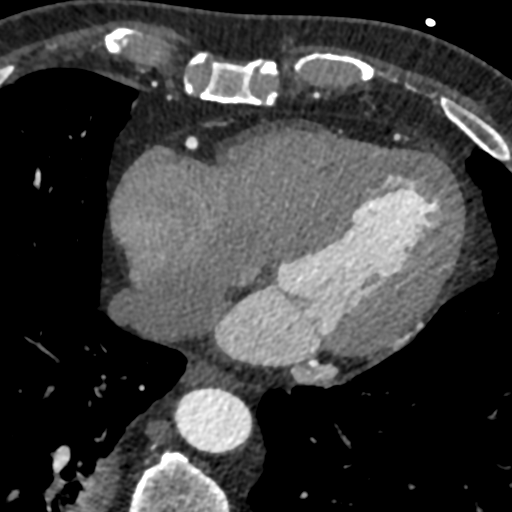
[im 111/331  lung]
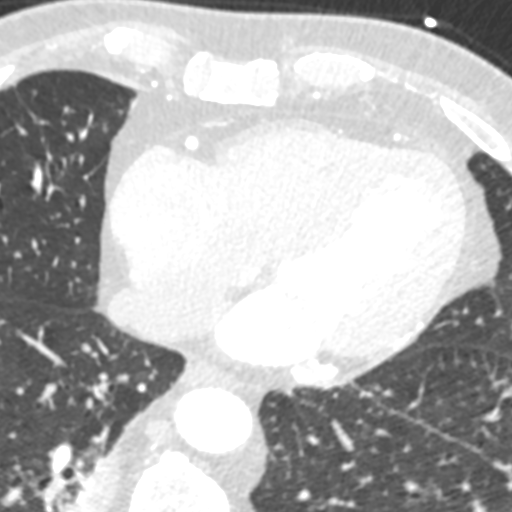
[im 221/331  vessel]
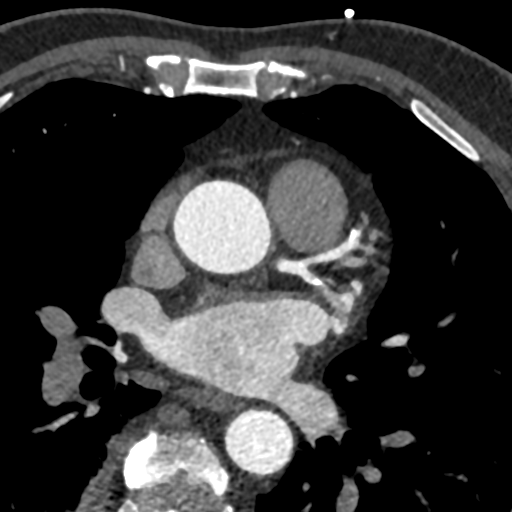

[Series 9: ts diast sharp 73 % · axial · 0.39mm/px · z∈[-324,-280]mm · 2 of 331 slices shown]
[im 111/331  lung]
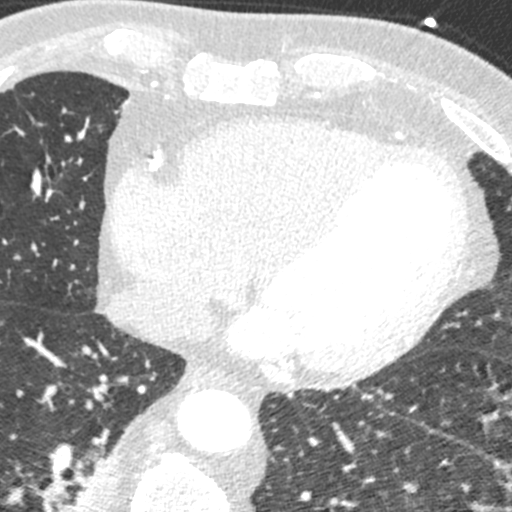
[im 221/331  lung]
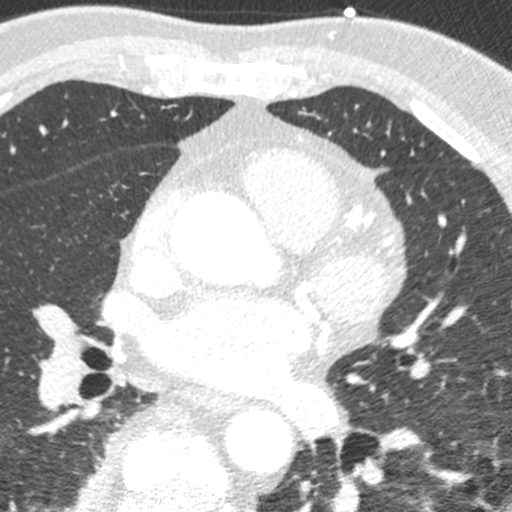

[Series 10: ts syst sharp · axial · 0.39mm/px · z∈[-324,-280]mm · 2 of 331 slices shown]
[im 111/331  lung]
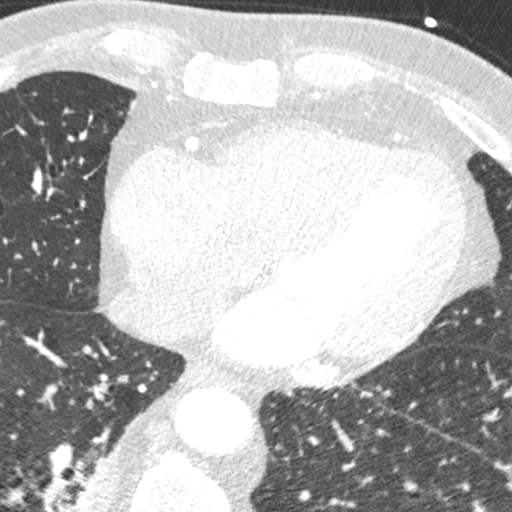
[im 221/331  lung]
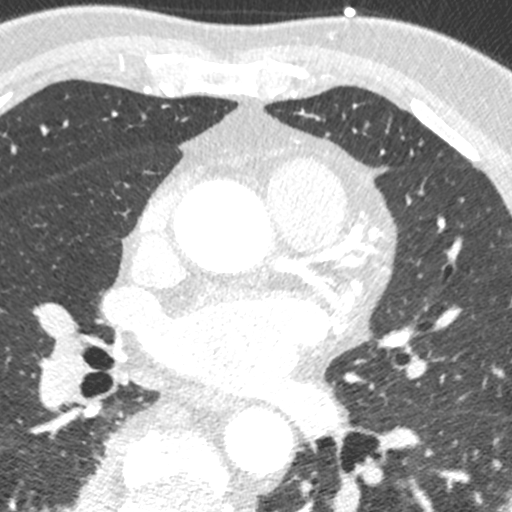

[8 of 20 positions shown; findings below may reference images not displayed]

FINDINGS: Vascular: No significant noncardiac vascular findings.

Mediastinum/Nodes: Visualized mediastinum and hilar regions
demonstrate no lymphadenopathy or masses.

Lungs/Pleura: There is atelectasis involving the posterior right
lung. Visualized lungs show no evidence of pulmonary edema,
consolidation, pneumothorax, nodule or pleural fluid.

Upper Abdomen: Stable small cyst at the dome of the right lobe of
the liver has a benign appearance and is stable since 3424.

Musculoskeletal: No chest wall mass or suspicious bone lesions
identified.
IMPRESSION: Atelectasis involving the posterior right lung.
FINDINGS: Coronary calcium score: The patient's coronary artery calcium score
is 545, which places the patient in the 59th percentile.

Coronary arteries: Normal coronary origins.  Right dominance.

Right Coronary Artery: Normal caliber vessel, gives rise to PDA.
Noncalcified ostial stenosis of 1-24% followed by mixed calcified
and noncalcified plaque in the proximal RCA with 1-24% stenosis.
There is scattered mixed calcified and noncalcified plaque
throughout the mid and distal RCA, with greatest stenosis 1-24%

Left Main Coronary Artery: Normal caliber vessel. Minimal mixed
calcified and noncalcified plaque with 1-24% stenosis.

Left Anterior Descending Coronary Artery: Normal caliber vessel.
Scattered diffuse mixed calcified and noncalcified plaque throughout
the LAD. There is a 25-49% stenosis in the mid LAD. Gives rise to 1
diagonal branch.

Left Circumflex Artery: Normal caliber vessel. Scattered diffuse
mixed calcified and noncalcified plaque throughout the LCx. Maximum
1-25% stenosis in the mid LCX. Gives rise to 2 OM branches.

Aorta: Normal size, 36 mm at the mid ascending aorta (level of the
PA bifurcation) measured double oblique. No significant
calcifications. No dissection.

Aortic Valve: No significant calcifications. Trileaflet.

Other findings:

Normal pulmonary vein drainage into the left atrium.

Normal left atrial appendage without a thrombus.

Normal size of the pulmonary artery.
IMPRESSION: 1. Diffuse mild nonobstructive CAD, CADRADS = 2.

2. Coronary calcium score of 545. This was 59th percentile for age
and sex matched control.

3. Normal coronary origin with right dominance.

INTERPRETATION:

1. CAD-RADS 0: No evidence of CAD (0%). Consider non-atherosclerotic
causes of chest pain.

2. CAD-RADS 1: Minimal non-obstructive CAD (0-24%). Consider
non-atherosclerotic causes of chest pain. Consider preventive
therapy and risk factor modification.

3. CAD-RADS 2: Mild non-obstructive CAD (25-49%). Consider
non-atherosclerotic causes of chest pain. Consider preventive
therapy and risk factor modification.

4. CAD-RADS 3: Moderate stenosis (50-69%). Consider symptom-guided
anti-ischemic pharmacotherapy as well as risk factor modification
per guideline directed care. Additional analysis with CT FFR will be
submitted.

5. CAD-RADS 4: Severe stenosis. (70-99% or > 50% left main). Cardiac
catheterization or CT FFR is recommended. Consider symptom-guided
anti-ischemic pharmacotherapy as well as risk factor modification
per guideline directed care. Invasive coronary angiography
recommended with revascularization per published guideline
statements.

6. CAD-RADS 5: Total coronary occlusion (100%). Consider cardiac
catheterization or viability assessment. Consider symptom-guided
anti-ischemic pharmacotherapy as well as risk factor modification
per guideline directed care.

7. CAD-RADS N: Non-diagnostic study. Obstructive CAD can't be
excluded. Alternative evaluation is recommended.

*** End of Addendum ***
EXAM:
OVER-READ INTERPRETATION  CT CHEST

The following report is an over-read performed by radiologist Dr.
Desile Sarai [REDACTED] on 08/10/2020. This
over-read does not include interpretation of cardiac or coronary
anatomy or pathology. The coronary CTA interpretation by the
cardiologist is attached.
FINDINGS: Vascular: No significant noncardiac vascular findings.

Mediastinum/Nodes: Visualized mediastinum and hilar regions
demonstrate no lymphadenopathy or masses.

Lungs/Pleura: There is atelectasis involving the posterior right
lung. Visualized lungs show no evidence of pulmonary edema,
consolidation, pneumothorax, nodule or pleural fluid.

Upper Abdomen: Stable small cyst at the dome of the right lobe of
the liver has a benign appearance and is stable since 3424.

Musculoskeletal: No chest wall mass or suspicious bone lesions
identified.
IMPRESSION: Atelectasis involving the posterior right lung.

## 2021-12-21 ENCOUNTER — Other Ambulatory Visit (HOSPITAL_BASED_OUTPATIENT_CLINIC_OR_DEPARTMENT_OTHER): Payer: Self-pay | Admitting: Family

## 2021-12-21 DIAGNOSIS — E785 Hyperlipidemia, unspecified: Secondary | ICD-10-CM

## 2021-12-21 NOTE — Telephone Encounter (Signed)
Pt of Dr. Pemberton. Please review for refill. Thank you! 

## 2022-01-26 DIAGNOSIS — Z23 Encounter for immunization: Secondary | ICD-10-CM | POA: Diagnosis not present

## 2022-03-08 DIAGNOSIS — N401 Enlarged prostate with lower urinary tract symptoms: Secondary | ICD-10-CM | POA: Diagnosis not present

## 2022-03-15 DIAGNOSIS — N401 Enlarged prostate with lower urinary tract symptoms: Secondary | ICD-10-CM | POA: Diagnosis not present

## 2022-03-15 DIAGNOSIS — R351 Nocturia: Secondary | ICD-10-CM | POA: Diagnosis not present

## 2022-06-07 DIAGNOSIS — E611 Iron deficiency: Secondary | ICD-10-CM | POA: Diagnosis not present

## 2022-06-07 DIAGNOSIS — Z6828 Body mass index (BMI) 28.0-28.9, adult: Secondary | ICD-10-CM | POA: Diagnosis not present

## 2022-06-07 DIAGNOSIS — I10 Essential (primary) hypertension: Secondary | ICD-10-CM | POA: Diagnosis not present

## 2022-06-07 DIAGNOSIS — Z0000 Encounter for general adult medical examination without abnormal findings: Secondary | ICD-10-CM | POA: Diagnosis not present

## 2022-06-07 DIAGNOSIS — E559 Vitamin D deficiency, unspecified: Secondary | ICD-10-CM | POA: Diagnosis not present

## 2022-06-07 DIAGNOSIS — R413 Other amnesia: Secondary | ICD-10-CM | POA: Diagnosis not present

## 2022-06-07 DIAGNOSIS — E782 Mixed hyperlipidemia: Secondary | ICD-10-CM | POA: Diagnosis not present

## 2022-06-14 DIAGNOSIS — I700 Atherosclerosis of aorta: Secondary | ICD-10-CM | POA: Diagnosis not present

## 2022-06-14 DIAGNOSIS — L578 Other skin changes due to chronic exposure to nonionizing radiation: Secondary | ICD-10-CM | POA: Diagnosis not present

## 2022-06-14 DIAGNOSIS — I10 Essential (primary) hypertension: Secondary | ICD-10-CM | POA: Diagnosis not present

## 2022-06-14 DIAGNOSIS — G4700 Insomnia, unspecified: Secondary | ICD-10-CM | POA: Diagnosis not present

## 2022-06-14 DIAGNOSIS — E559 Vitamin D deficiency, unspecified: Secondary | ICD-10-CM | POA: Diagnosis not present

## 2022-06-14 DIAGNOSIS — K219 Gastro-esophageal reflux disease without esophagitis: Secondary | ICD-10-CM | POA: Diagnosis not present

## 2022-06-14 DIAGNOSIS — E782 Mixed hyperlipidemia: Secondary | ICD-10-CM | POA: Diagnosis not present

## 2022-06-14 DIAGNOSIS — Z6828 Body mass index (BMI) 28.0-28.9, adult: Secondary | ICD-10-CM | POA: Diagnosis not present

## 2022-06-14 DIAGNOSIS — E611 Iron deficiency: Secondary | ICD-10-CM | POA: Diagnosis not present

## 2022-07-03 DIAGNOSIS — L570 Actinic keratosis: Secondary | ICD-10-CM | POA: Diagnosis not present

## 2022-07-03 DIAGNOSIS — L918 Other hypertrophic disorders of the skin: Secondary | ICD-10-CM | POA: Diagnosis not present

## 2022-07-06 DIAGNOSIS — H43812 Vitreous degeneration, left eye: Secondary | ICD-10-CM | POA: Diagnosis not present

## 2022-07-18 NOTE — Progress Notes (Signed)
Cardiology Office Note:    Date:  07/21/2022   ID:  Joseph Reilly, DOB December 18, 1945, MRN [id number]  PCP:  Lawerance Cruel, MD   [facility]  Cardiologist:  Freada Bergeron, MD  Advanced Practice Provider:  No care team member to display Electrophysiologist:  None   Referring MD: Lawerance Cruel, MD    History of Present Illness:    Joseph Reilly is a 77 y.o. male with a hx of CAD, HTN, HLD and GERD who presents to clinic for follow-up.  Patient initially saw Dr. [place] in 2013 for epigastric discomfort. Stress echo 2013 normal.   Was referred back to [place] in 06/2020 for worsening dyspnea on exertion.  Echo ordered and performed 07/07/20 showing LVEF 55-60%, no RWMA, gr1DD, RV normal size/function, normal PASP, trivial MR, mild dilation of aortic root 16mm and mild dilation of ascending aorta 19mm.Subsequent cardiac CTA 07/2020 with diffuse mild nonobstructive CAD (CADRADS2) and coronary calcium score of 545 placing him in 59th percentile for age and sex matched control.  Last seen in clinic on  06/2021 where he was doing well from a CV standpoint.   Today, the patient feels well. Continues to have epigastric discomfort which not exertional in nature. Has been diagnosed with GERD and has been placed on PPI and carafate. No SOB, orthopnea, PND, or LE edema. He is active and is able to do yardwork without issue.    Past Medical History:  Diagnosis Date   Adenomatous polyp of colon    Arthritis    CAD (coronary artery disease)    GERD (gastroesophageal reflux disease)    Hemorrhoids    Hiatal hernia    small    History of gallstones    Hyperlipemia    Hypertension    IBS (irritable bowel syndrome)    Trigger finger    bilateral thumbs   Varicose veins     Past Surgical History:  Procedure Laterality Date   CARDIAC CATHETERIZATION  2011   CARDIAC CATHETERIZATION  07/14/2008   EF 60%   ENDOVENOUS ABLATION SAPHENOUS VEIN W/ LASER Right  10-22-2014   endovenous laser ablation right greater saphenous vein by Curt Jews MD   ENDOVENOUS ABLATION SAPHENOUS VEIN W/ LASER Left 11-19-2014   endovenous laser ablation (left greater saphenous vein) by Curt Jews MD   HYDROCELE EXCISION / REPAIR     LUMBAR SPINE SURGERY     TONSILLECTOMY     TRANSTHORACIC ECHOCARDIOGRAM  07/14/2008   EF 60%    Current Medications: Current Meds  Medication Sig   amitriptyline (ELAVIL) 25 MG tablet Take 25 mg by mouth. 1/4 tablet by mouth daily   aspirin 81 MG tablet Take 81 mg by mouth daily.   B-Complex CAPS daily.   Cholecalciferol (VITAMIN D3) 1000 UNITS CAPS Take 1 capsule by mouth 2 (two) times daily.   Coenzyme Q10 200 MG capsule Take 200 mg by mouth daily.   ezetimibe (ZETIA) 10 MG tablet TAKE 1 TABLET BY MOUTH EVERY DAY   fluorouracil (EFUDEX) 5 % cream 5 % daily. Two times daily   Glucosamine Sulfate 1000 MG CAPS Take 1 capsule by mouth 2 (two) times daily.   ibuprofen (ADVIL) 200 MG tablet as needed.   IRON-VITAMIN C PO Take 65 mcg by mouth 3 (three) times a week. Mon Wed Fri   losartan (COZAAR) 100 MG tablet Take 100 mg by mouth daily.   Magnesium 250 MG TABS daily.   Melatonin  3 MG CAPS Take by mouth. 1/4  Tablet by mouth once daily   meloxicam (MOBIC) 15 MG tablet as needed.   Multiple Vitamin (MULTI-VITAMIN PO) Take by mouth daily.   omeprazole (PRILOSEC) 40 MG capsule Take 40 mg by mouth daily.   Plant Sterols and Stanols (CHOLESTOFF) 450 MG TABS 2 tablets in the morning and at bedtime.   rosuvastatin (CRESTOR) 10 MG tablet Take 1 tablet (10 mg total) by mouth daily.   sucralfate (CARAFATE) 1 g tablet Take 1 g by mouth 2 (two) times daily.   [DISCONTINUED] losartan (COZAAR) 50 MG tablet Take 100 mg by mouth daily. 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, mining  Tobacco Use   Smoking status: Never   Smokeless tobacco: Never  Substance and Sexual Activity   Alcohol use: No    Alcohol/week: 0.0 standard drinks of alcohol   Drug use: No   Sexual activity: Not on file  Other Topics Concern   Not on file  Social History Narrative   Caffeine drink daily    Social Determinants of Health   Financial Resource Strain: Not on file  Food Insecurity: Not on file  Transportation Needs: Not on file  Physical Activity: Not on file  Stress: Not on file  Social Connections: Not on file     Family History: The patient's family history includes Diabetes (age of onset: 37) in his father; Heart disease in his mother. There is no history of Colon cancer.  ROS:   Please see the history of present illness.    Review of Systems  Constitutional:  Negative for chills, fever and malaise/fatigue.  HENT:  Negative for nosebleeds.   Eyes:  Negative for redness.  Cardiovascular:  Negative for chest pain, palpitations, orthopnea, claudication, leg swelling and PND.  Gastrointestinal:  Positive for abdominal pain. Negative for melena, nausea and vomiting.  Genitourinary:  Negative for hematuria.  Musculoskeletal:  Positive for myalgias. Negative for falls.  Neurological:  Negative for dizziness and loss of consciousness.  Endo/Heme/Allergies:  Negative for polydipsia.  Psychiatric/Behavioral:  Negative for substance abuse.     EKGs/Labs/Other Studies Reviewed:    The following studies were reviewed today: Echo 07/07/20  1. Left ventricular ejection fraction, by estimation, is 55 to 60%. The  left ventricle has normal function. The left ventricle has no  regional  wall motion abnormalities. Left ventricular diastolic parameters are  consistent with Grade I diastolic  dysfunction (impaired relaxation). The average left ventricular global  longitudinal strain is -22.5 %. The global longitudinal strain is normal.   2. Right ventricular systolic function is normal. The right ventricular  size is normal. There is normal pulmonary artery systolic pressure.   3. The mitral valve is normal in structure. Trivial mitral valve  regurgitation.   4. The aortic valve is tricuspid. Aortic valve regurgitation is not  visualized. No aortic stenosis is present.   5. Aortic dilatation noted. There is mild dilatation  of the aortic root,  measuring 38 mm. There is mild dilatation of the ascending aorta,  measuring 37 mm.   6. The inferior vena cava is normal in size with greater than 50%  respiratory variability, suggesting right atrial pressure of 3 mmHg.    Cardiac CTA 07/2020 FINDINGS: Coronary calcium score: The patient's coronary artery calcium score is 545, which places the patient in the 59th percentile.   Coronary arteries: Normal coronary origins.  Right dominance.   Right Coronary Artery: Normal caliber vessel, gives rise to PDA. Noncalcified ostial stenosis of 1-24% followed by mixed calcified and noncalcified plaque in the proximal RCA with 1-24% stenosis. There is scattered mixed calcified and noncalcified plaque throughout the mid and distal RCA, with greatest stenosis 1-24%   Left Main Coronary Artery: Normal caliber vessel. Minimal mixed calcified and noncalcified plaque with 1-24% stenosis.   Left Anterior Descending Coronary Artery: Normal caliber vessel. Scattered diffuse mixed calcified and noncalcified plaque throughout the LAD. There is a 25-49% stenosis in the mid LAD. Gives rise to 1 diagonal branch.   Left Circumflex Artery: Normal caliber vessel. Scattered diffuse mixed calcified and noncalcified plaque throughout the LCx.  Maximum 1-25% stenosis in the mid LCX. Gives rise to 2 OM branches.   Aorta: Normal size, 36 mm at the mid ascending aorta (level of the PA bifurcation) measured double oblique. No significant calcifications. No dissection.   Aortic Valve: No significant calcifications. Trileaflet.   Other findings:   Normal pulmonary vein drainage into the left atrium.   Normal left atrial appendage without a thrombus.   Normal size of the pulmonary artery.   IMPRESSION: 1. Diffuse mild nonobstructive CAD, CADRADS = 2.   2. Coronary calcium score of 545. This was 59th percentile for age and sex matched control.   3. Normal coronary origin with right dominance.   Stress Echo 2013: Study Conclusions   - Stress ECG conclusions: The stress ECG was normal.  - Baseline: LV global systolic function was normal. The    estimated LV ejection fraction was 65%. Normal wall    motion; no LV regional wall motion abnormalities.  - Peak stress: LV global systolic function was vigorous.    Normal wall motion; no LV regional wall motion    abnormalities.  - Impressions: Normal stress echo.  Impressions:   - Normal stress echo.    TTE 2010: LEFT VENTRICLE:   -  The left ventricle was grossly normal.   -  Left ventricular size was normal.   -  Overall left ventricular systolic function was normal.   -  Left ventricular ejection fraction was estimated to be 60 %.   -  There were no left ventricular regional wall motion         abnormalities.   -  Left ventricular wall thickness was mildly increased.    AORTIC VALVE:   -  The aortic valve was grossly normal.    AORTA:   -  The aorta was grossly normal.    LEFT ATRIUM:   -  Left atrial size was at the upper limits of normal.    RIGHT VENTRICLE:   -  Right ventricular size was normal.   -  Right ventricular systolic function was normal.   -  Right ventricular wall thickness was normal.    TRICUSPID VALVE:   -  The tricuspid valve was grossly  normal.    RIGHT ATRIUM:   -  The right atrium was grossly normal.  PERICARDIUM:   -  The pericardium was grossly normal.   -  The amount of pericardial fluid appeared to be at the upper         limits of normal.    EKG:  SB with HR 58-personally reviewed  Recent Labs: No results found for requested labs within last 365 days.  Recent Lipid Panel    Component Value Date/Time   CHOL 117 04/19/2021 0947   TRIG 140 04/19/2021 0947   HDL 36 (L) 04/19/2021 0947   CHOLHDL 3.3 04/19/2021 0947   CHOLHDL 4.3 07/14/2008 0355   VLDL 12 07/14/2008 0355   LDLCALC 56 04/19/2021 0947     Physical Exam:    VS:  Pulse (!) 58   Ht 5\' 11"  (1.803 m)   Wt 195 lb 3.2 oz (88.5 kg)   SpO2 95%   BMI 27.22 kg/m     Wt Readings from Last 3 Encounters:  07/21/22 195 lb 3.2 oz (88.5 kg)  07/15/21 197 lb (89.4 kg)  12/30/20 197 lb (89.4 kg)     GEN:  Well nourished, well developed in no acute distress HEENT: Normal NECK: No JVD; No carotid bruits CARDIAC: RRR, no murmurs, rubs, gallops RESPIRATORY:  CTAB, no wheezes ABDOMEN: Soft, non-tender, non-distended MUSCULOSKELETAL:  No edema; No deformity. Warm SKIN: Warm and dry NEUROLOGIC:  Alert and oriented x 3 PSYCHIATRIC:  Normal affect   ASSESSMENT:    1. Coronary artery disease of native artery of native heart with stable angina pectoris ([place])   2. Mixed hyperlipidemia   3. Essential hypertension   4. Gastroesophageal reflux disease, unspecified whether esophagitis present      PLAN:    In order of problems listed above:  #Dyspnea on exertion: Reassuring work-up. TTE 07/07/20 showed LVEF 55-60%, no RWMA, gr1DD, RV normal size/function, normal PASP, trivial MR, mild dilation of aortic root 49mm and mild dilation of ascnding aorta 28mm. Cardiac CTA 07/2020 with diffuse mild nonobstructive CAD (CADRADS2) and coronary calcium score of 545 placing him in 59th percentile for age and sex matched control. -Continue ASA 81mg , crestor 5mg   daily -Continue exercise and lifestyle modifications  #CAD: Cardiac CTA 07/2020 with diffuse mild nonobstructive CAD (CADRADS2) and coronary calcium score of 545 placing him in 59th percentile for age and sex matched control. -Continue ASA 81mg , crestor 10mg  daily, zetia 10mg  daily  #HTN: Well controlled at home. -Continue losartan 100mg  daily  #HLD: -Crestor 10mg  daily -Continue zetia 10mg  daily -LDL very well controlled 48    Medication Adjustments/Labs and Tests Ordered: Current medicines are reviewed at length with the patient today.  Concerns regarding medicines are outlined above.  Orders Placed This Encounter  Procedures   EKG 12-Lead   No orders of the defined types were placed in this encounter.   Patient Instructions  Medication Instructions:   Your physician recommends that you continue on your current medications as directed. Please refer to the Current Medication list given to you today.  *If you need a refill on your cardiac medications before your next appointment, please call your pharmacy*    Follow-Up: At [facility], you and your health needs are our priority.  As part of our continuing mission to provide you with exceptional heart care, we have created designated Provider Care Teams.  These Care Teams include your primary Cardiologist (physician) and Advanced Practice Providers (APPs -  Physician Assistants and Nurse Practitioners) who all work together to provide you with the care you need, when you  need it.  We recommend signing up for the patient portal called "MyChart".  Sign up information is provided on this After Visit Summary.  MyChart is used to connect with patients for Virtual Visits (Telemedicine).  Patients are able to view lab/test results, encounter notes, upcoming appointments, etc.  Non-urgent messages can be sent to your provider as well.   To learn more about what you can do with MyChart, go to [email or web address].    Your  next appointment:   1 year(s)  Provider:   Freada Bergeron, MD         Signed, Freada Bergeron, MD  07/21/2022 9:20 AM    [place]

## 2022-07-21 ENCOUNTER — Ambulatory Visit: Payer: Medicare Other | Attending: Cardiology | Admitting: Cardiology

## 2022-07-21 ENCOUNTER — Encounter: Payer: Self-pay | Admitting: Cardiology

## 2022-07-21 VITALS — HR 58 | Ht 71.0 in | Wt 195.2 lb

## 2022-07-21 DIAGNOSIS — E782 Mixed hyperlipidemia: Secondary | ICD-10-CM

## 2022-07-21 DIAGNOSIS — K219 Gastro-esophageal reflux disease without esophagitis: Secondary | ICD-10-CM | POA: Diagnosis not present

## 2022-07-21 DIAGNOSIS — I10 Essential (primary) hypertension: Secondary | ICD-10-CM

## 2022-07-21 DIAGNOSIS — I25118 Atherosclerotic heart disease of native coronary artery with other forms of angina pectoris: Secondary | ICD-10-CM | POA: Diagnosis not present

## 2022-07-21 NOTE — Patient Instructions (Signed)
Medication Instructions:   Your physician recommends that you continue on your current medications as directed. Please refer to the Current Medication list given to you today.  *If you need a refill on your cardiac medications before your next appointment, please call your pharmacy*    Follow-Up: At [facility], you and your health needs are our priority.  As part of our continuing mission to provide you with exceptional heart care, we have created designated Provider Care Teams.  These Care Teams include your primary Cardiologist (physician) and Advanced Practice Providers (APPs -  Physician Assistants and Nurse Practitioners) who all work together to provide you with the care you need, when you need it.  We recommend signing up for the patient portal called "MyChart".  Sign up information is provided on this After Visit Summary.  MyChart is used to connect with patients for Virtual Visits (Telemedicine).  Patients are able to view lab/test results, encounter notes, upcoming appointments, etc.  Non-urgent messages can be sent to your provider as well.   To learn more about what you can do with MyChart, go to [email or web address].    Your next appointment:   1 year(s)  Provider:   Heather E Pemberton, MD      

## 2022-07-25 ENCOUNTER — Encounter: Payer: Self-pay | Admitting: Cardiology

## 2022-07-31 DIAGNOSIS — H25012 Cortical age-related cataract, left eye: Secondary | ICD-10-CM | POA: Diagnosis not present

## 2022-07-31 DIAGNOSIS — H5203 Hypermetropia, bilateral: Secondary | ICD-10-CM | POA: Diagnosis not present

## 2022-07-31 DIAGNOSIS — H2513 Age-related nuclear cataract, bilateral: Secondary | ICD-10-CM | POA: Diagnosis not present

## 2022-07-31 DIAGNOSIS — H43812 Vitreous degeneration, left eye: Secondary | ICD-10-CM | POA: Diagnosis not present

## 2022-07-31 DIAGNOSIS — H524 Presbyopia: Secondary | ICD-10-CM | POA: Diagnosis not present

## 2023-02-12 DIAGNOSIS — Z23 Encounter for immunization: Secondary | ICD-10-CM | POA: Diagnosis not present

## 2023-03-13 DIAGNOSIS — N401 Enlarged prostate with lower urinary tract symptoms: Secondary | ICD-10-CM | POA: Diagnosis not present

## 2023-03-13 DIAGNOSIS — R351 Nocturia: Secondary | ICD-10-CM | POA: Diagnosis not present

## 2023-03-13 DIAGNOSIS — R102 Pelvic and perineal pain: Secondary | ICD-10-CM | POA: Diagnosis not present

## 2023-06-04 ENCOUNTER — Ambulatory Visit (INDEPENDENT_AMBULATORY_CARE_PROVIDER_SITE_OTHER): Payer: Medicare Other | Admitting: Podiatry

## 2023-06-04 ENCOUNTER — Encounter: Payer: Self-pay | Admitting: Podiatry

## 2023-06-04 DIAGNOSIS — L600 Ingrowing nail: Secondary | ICD-10-CM

## 2023-06-04 DIAGNOSIS — B49 Unspecified mycosis: Secondary | ICD-10-CM | POA: Diagnosis not present

## 2023-06-04 MED ORDER — CEPHALEXIN 500 MG PO CAPS: 500.0000 mg | ORAL_CAPSULE | Freq: Three times a day (TID) | ORAL | 2 refills | Status: AC

## 2023-06-04 NOTE — Progress Notes (Signed)
Subjective:   Patient ID: Joseph Reilly, male   DOB: 78 y.o.   MRN: [id number]   HPI Chief Complaint  Patient presents with   Ingrown Toenail    RM#13 Left foot big toe possible ingrown and would like both feet to be evaluated.   78 year old male presents the office with above concerns.  He states that about a year ago he was trying to trim his nails on his foot and since then he been going immersing the skin may cause discomfort.  He tried to trim himself.  Has not seen drainage or pus.  No other treatment.  No other concerns.  Review of Systems  All other systems reviewed and are negative.  Past Medical History:  Diagnosis Date   Adenomatous polyp of colon    Arthritis    CAD (coronary artery disease)    GERD (gastroesophageal reflux disease)    Hemorrhoids    Hiatal hernia    small    History of gallstones    Hyperlipemia    Hypertension    IBS (irritable bowel syndrome)    Trigger finger    bilateral thumbs   Varicose veins     Past Surgical History:  Procedure Laterality Date   CARDIAC CATHETERIZATION  2011   CARDIAC CATHETERIZATION  07/14/2008   EF 60%   ENDOVENOUS ABLATION SAPHENOUS VEIN W/ LASER Right 10-22-2014   endovenous laser ablation right greater saphenous vein by Gretta Began MD   ENDOVENOUS ABLATION SAPHENOUS VEIN W/ LASER Left 11-19-2014   endovenous laser ablation (left greater saphenous vein) by Gretta Began MD   HYDROCELE EXCISION / REPAIR     LUMBAR SPINE SURGERY     TONSILLECTOMY     TRANSTHORACIC ECHOCARDIOGRAM  07/14/2008   EF 60%     Current Outpatient Medications:    amitriptyline (ELAVIL) 25 MG tablet, Take 25 mg by mouth. 1/4 tablet by mouth daily, Disp: , Rfl:    aspirin 81 MG tablet, Take 81 mg by mouth daily., Disp: , Rfl:    B-Complex CAPS, daily., Disp: , Rfl:    cephALEXin (KEFLEX) 500 MG capsule, Take 1 capsule (500 mg total) by mouth 3 (three) times daily., Disp: 30 capsule, Rfl: 2   Cholecalciferol (VITAMIN D3) 1000 UNITS CAPS,  Take 1 capsule by mouth 2 (two) times daily., Disp: , Rfl:    Coenzyme Q10 200 MG capsule, Take 200 mg by mouth daily., Disp: , Rfl:    ezetimibe (ZETIA) 10 MG tablet, TAKE 1 TABLET BY MOUTH EVERY DAY, Disp: 90 tablet, Rfl: 2   fluorouracil (EFUDEX) 5 % cream, 5 % daily. Two times daily, Disp: , Rfl:    Glucosamine Sulfate 1000 MG CAPS, Take 1 capsule by mouth 2 (two) times daily., Disp: , Rfl:    ibuprofen (ADVIL) 200 MG tablet, as needed., Disp: , Rfl:    IRON-VITAMIN C PO, Take 65 mcg by mouth 3 (three) times a week. Mon Wed Fri, Disp: , Rfl:    losartan (COZAAR) 100 MG tablet, Take 100 mg by mouth daily., Disp: , Rfl:    Magnesium 250 MG TABS, daily., Disp: , Rfl:    Melatonin 3 MG CAPS, Take by mouth. 1/4  Tablet by mouth once daily, Disp: , Rfl:    meloxicam (MOBIC) 15 MG tablet, as needed., Disp: , Rfl:    Multiple Vitamin (MULTI-VITAMIN PO), Take by mouth daily., Disp: , Rfl:    omeprazole (PRILOSEC) 40 MG capsule, Take 40 mg by mouth daily.,  Disp: , Rfl: 3   Plant Sterols and Stanols (CHOLESTOFF) 450 MG TABS, 2 tablets in the morning and at bedtime., Disp: , Rfl:    rosuvastatin (CRESTOR) 10 MG tablet, Take 1 tablet (10 mg total) by mouth daily., Disp: 90 tablet, Rfl: 1   sucralfate (CARAFATE) 1 g tablet, Take 1 g by mouth 2 (two) times daily., Disp: , Rfl:   Current Facility-Administered Medications:    0.9 %  sodium chloride infusion, , Intravenous, PRN, Sigmund Hazel, MD  Allergies  Allergen Reactions   Lisinopril Cough   Lotensin [Benazepril Hcl]     "Allergic"           Objective:  Physical Exam  General: AAO x3, NAD  Dermatological: Bilateral hallux toes are incurvated on both medial, lateral nail borders.  The nails are hypertrophic and dystrophic with yellow discoloration left side worse than the right.  There is minimal edema of the nail borders but there is no cellulitis.  No drainage or pus.  No open lesions.  Vascular: Dorsalis Pedis artery and Posterior  Tibial artery pedal pulses are 2/4 bilateral with immedate capillary fill time.  There is no pain with calf compression, swelling, warmth, erythema.   Neruologic: Grossly intact via light touch bilateral.   Musculoskeletal: Tenderness to ingrown toenails.  No other areas of discomfort.       Assessment:   78 year old male with ingrown toenails, onychodystrophy     Plan:  -Treatment options discussed including all alternatives, risks, and complications -Etiology of symptoms were discussed -At this time, the patient is requesting partial nail removal with chemical matricectomy to the symptomatic portion of the nail. Risks and complications were discussed with the patient for which they understand and written consent was obtained. Under sterile conditions a total of 3 mL of a mixture of 2% lidocaine plain and 0.5% Marcaine plain was infiltrated in a hallux block fashion. Once anesthetized, the skin was prepped in sterile fashion. A tourniquet was then applied. Next the medial, lateral aspect of hallux nail border was then sharply excised making sure to remove the entire offending nail border. Once the nails were ensured to be removed area was debrided and the underlying skin was intact. There is no purulence identified in the procedure. Next phenol was then applied under standard conditions and copiously irrigated.  Silvadene was applied. A dry sterile dressing was applied. After application of the dressing the tourniquet was removed and there is found to be an immediate capillary refill time to the digit. The patient tolerated the procedure well any complications. Post procedure instructions were discussed the patient for which he verbally understood. Discussed signs/symptoms of infection and directed to call the office immediately should any occur or go directly to the emergency room. In the meantime, encouraged to call the office with any questions, concerns, changes symptoms. -Keflex -Nails sent  for culture to Isaiah Blakes DPM

## 2023-06-04 NOTE — Patient Instructions (Signed)

## 2023-06-12 ENCOUNTER — Other Ambulatory Visit: Payer: Self-pay | Admitting: Podiatry

## 2023-06-13 ENCOUNTER — Encounter: Payer: Self-pay | Admitting: Podiatry

## 2023-06-18 LAB — LAB REPORT - SCANNED
Calcium: 9.2
Creatinine, POC: 107 mg/dL
EGFR: 74
HM Hepatitis Screen: NEGATIVE
Microalb Creat Ratio: 7.3
Microalbumin, Urine: 0.78

## 2023-06-19 ENCOUNTER — Encounter: Payer: Self-pay | Admitting: Podiatry

## 2023-06-19 ENCOUNTER — Ambulatory Visit (INDEPENDENT_AMBULATORY_CARE_PROVIDER_SITE_OTHER): Payer: Medicare Other | Admitting: Podiatry

## 2023-06-19 DIAGNOSIS — B49 Unspecified mycosis: Secondary | ICD-10-CM | POA: Diagnosis not present

## 2023-06-19 DIAGNOSIS — L600 Ingrowing nail: Secondary | ICD-10-CM

## 2023-06-19 DIAGNOSIS — L539 Erythematous condition, unspecified: Secondary | ICD-10-CM | POA: Diagnosis not present

## 2023-06-19 MED ORDER — SULFAMETHOXAZOLE-TRIMETHOPRIM 800-160 MG PO TABS: 1.0000 | ORAL_TABLET | Freq: Two times a day (BID) | ORAL | 0 refills | Status: AC

## 2023-06-19 NOTE — Patient Instructions (Signed)
 Continue soaking in epsom salts twice a day followed by antibiotic ointment and a band-aid. Can leave uncovered at night. Continue this until completely healed.  If the area has not healed in 2 weeks, call the office for follow-up appointment, or sooner if any problems arise.  Monitor for any signs/symptoms of infection. Call the office immediately if any occur or go directly to the emergency room. Call with any questions/concerns.

## 2023-06-19 NOTE — Progress Notes (Unsigned)
 Subjective: Chief Complaint  Patient presents with   Nail Problem   Routine Post Op    RM#13 Follow up on bilateral big to ingrown nail removal right big toe red and swollen left big toe seems to be healing not as sensitive as right.   78 year old male presents the office today for follow-up evaluation after an unfortunate avulsion.  States he still gets tenderness and swelling to the right big toe.  No purulence.  Some sensitivity present.  He has been using Q-tips applied antibiotic ointment.  No fevers or chills.  No other concerns.  Objective: AAO x3, NAD DP/PT pulses palpable bilaterally, CRT less than 3 seconds Status post partial nail avulsion.  There is some local edema erythema on the left side however on the right side appears to be worsening lateral border patient global IPJ without any ascending cellulitis.  There is no purulence noted.  There is no fluctuation or crepitation.  There is no malodor. No pain with calf compression, swelling, warmth, erythema  Assessment: Status post partial avulsion localized erythema; onychomycosis  Plan: -All treatment options discussed with the patient including all alternatives, risks, complications.  -Procedure site appears to be healing okay however concerned about the redness on the right big toe.  Will switch his antibiotic, prescribed Bactrim.  Continue soaking Epsom salts, do not appointment in the bandage.  Monitoring signs or symptoms of worsening infection on evaluation any occur. -We discussed treatment options for nail fungus.  He would be okay doing oral Lamisil, pulsed dosing.  Blood work by his PCP yesterday.  I want the infection to resolve before starting medication. -Patient encouraged to call the office with any questions, concerns, change in symptoms.   Return in about 2 weeks (around 07/03/2023) for nail check .  Vivi Barrack DPM

## 2023-07-05 ENCOUNTER — Ambulatory Visit (INDEPENDENT_AMBULATORY_CARE_PROVIDER_SITE_OTHER): Payer: Medicare Other | Admitting: Podiatry

## 2023-07-05 ENCOUNTER — Encounter: Payer: Self-pay | Admitting: Podiatry

## 2023-07-05 DIAGNOSIS — B351 Tinea unguium: Secondary | ICD-10-CM

## 2023-07-05 DIAGNOSIS — L600 Ingrowing nail: Secondary | ICD-10-CM | POA: Diagnosis not present

## 2023-07-05 NOTE — Patient Instructions (Signed)

## 2023-07-06 ENCOUNTER — Other Ambulatory Visit: Payer: Self-pay | Admitting: Podiatry

## 2023-07-06 ENCOUNTER — Encounter: Payer: Self-pay | Admitting: Podiatry

## 2023-07-06 DIAGNOSIS — Z79899 Other long term (current) drug therapy: Secondary | ICD-10-CM

## 2023-07-06 MED ORDER — TERBINAFINE HCL 250 MG PO TABS: 250.0000 mg | ORAL_TABLET | Freq: Every day | ORAL | 0 refills | Status: DC

## 2023-07-09 NOTE — Progress Notes (Signed)
 Subjective: Chief Complaint  Patient presents with   Nail Problem    RM#11 Follow up nail problem states hit his toe and has been taking care of it at home.    78 year old male presents the office today for follow-up evaluation after undergoing partial nail avulsion.  States he is doing better and is not tender.  Still some redness but no drainage or pus no red streaks.  Objective: AAO x3, NAD DP/PT pulses palpable bilaterally, CRT less than 3 seconds Status post partial nail avulsion.  There is some local edema and mild erythema localized over the nail border which I think is more from inflammation as opposed to infection at this time there is no drainage or pus.  No ascending cellulitis.  There is no fluctuation, crepitation.  No malodor. No pain with calf compression, swelling, warmth, erythema  Assessment: Status post partial avulsion localized erythema; onychomycosis  Plan: -All treatment options discussed with the patient including all alternatives, risks, complications.  -Overall seems to doing better.  Encouraged to continue soaking Epsom salts, antibiotic ointment and a bandage for the day but continue to leave open at nighttime.  Expect the symptoms to continue to improve however if they do not improve the next couple weeks or they were to worsen to let me know. -Discussed options for nail fungus.  I will get the blood work from his PCP before ordering Lamisil.  No follow-ups on file.  Vivi Barrack DPM

## 2023-08-03 ENCOUNTER — Other Ambulatory Visit: Payer: Self-pay | Admitting: Podiatry

## 2023-08-08 ENCOUNTER — Ambulatory Visit: Payer: Medicare Other | Attending: Cardiovascular Disease | Admitting: Cardiovascular Disease

## 2023-08-08 VITALS — BP 130/84 | HR 73 | Ht 71.0 in | Wt 197.6 lb

## 2023-08-08 DIAGNOSIS — E782 Mixed hyperlipidemia: Secondary | ICD-10-CM

## 2023-08-08 DIAGNOSIS — I10 Essential (primary) hypertension: Secondary | ICD-10-CM

## 2023-08-08 DIAGNOSIS — I25118 Atherosclerotic heart disease of native coronary artery with other forms of angina pectoris: Secondary | ICD-10-CM | POA: Diagnosis not present

## 2023-08-08 NOTE — Progress Notes (Unsigned)
 Chief Complaint  Patient presents with   Follow-up    CAD   History of Present Illness: 78 yo male with history of CAD, HTN, HLD and GERD who is here today for follow up. He has been followed in our office by Dr. Shari Prows. Echo in March 2022 with LVEF=55-60%, trivial MR, mild dilation of the ascending aorta at 3.8 cm. Cardiac CTA April 2022 with calcium score of 545 and diffuse mild non-obstructive CAD. He was last seen by Dr. Shari Prows in March 2024 and had c/o epigastric pain felt to be due to GERD.   He is here today for follow up. The patient denies any chest pain, dyspnea, palpitations, lower extremity edema, orthopnea, PND, dizziness, near syncope or syncope.   Primary Care Physician: Daisy Floro, MD   Past Medical History:  Diagnosis Date   Adenomatous polyp of colon    Arthritis    CAD (coronary artery disease)    GERD (gastroesophageal reflux disease)    Hemorrhoids    Hiatal hernia    small    History of gallstones    Hyperlipemia    Hypertension    IBS (irritable bowel syndrome)    Trigger finger    bilateral thumbs   Varicose veins     Past Surgical History:  Procedure Laterality Date   CARDIAC CATHETERIZATION  2011   CARDIAC CATHETERIZATION  07/14/2008   EF 60%   ENDOVENOUS ABLATION SAPHENOUS VEIN W/ LASER Right 10-22-2014   endovenous laser ablation right greater saphenous vein by Gretta Began MD   ENDOVENOUS ABLATION SAPHENOUS VEIN W/ LASER Left 11-19-2014   endovenous laser ablation (left greater saphenous vein) by Gretta Began MD   HYDROCELE EXCISION / REPAIR     LUMBAR SPINE SURGERY     TONSILLECTOMY     TRANSTHORACIC ECHOCARDIOGRAM  07/14/2008   EF 60%    Current Outpatient Medications  Medication Sig Dispense Refill   amitriptyline (ELAVIL) 25 MG tablet Take 25 mg by mouth. 1/4 tablet by mouth daily     aspirin 81 MG tablet Take 81 mg by mouth daily.     B-Complex CAPS daily.     Cholecalciferol (VITAMIN D3) 1000 UNITS CAPS Take 1 capsule  by mouth 2 (two) times daily.     Coenzyme Q10 200 MG capsule Take 200 mg by mouth daily.     ezetimibe (ZETIA) 10 MG tablet TAKE 1 TABLET BY MOUTH EVERY DAY 90 tablet 2   Glucosamine Sulfate 1000 MG CAPS Take 1 capsule by mouth 2 (two) times daily.     ibuprofen (ADVIL) 200 MG tablet as needed.     IRON-VITAMIN C PO Take 65 mcg by mouth 3 (three) times a week. Mon Wed Fri     losartan (COZAAR) 100 MG tablet Take 100 mg by mouth daily.     Magnesium 250 MG TABS daily.     Melatonin 3 MG CAPS Take by mouth. 1/4  Tablet by mouth once daily     meloxicam (MOBIC) 15 MG tablet as needed.     Multiple Vitamin (MULTI-VITAMIN PO) Take by mouth daily.     omeprazole (PRILOSEC) 40 MG capsule Take 40 mg by mouth daily.  3   rosuvastatin (CRESTOR) 10 MG tablet Take 1 tablet (10 mg total) by mouth daily. 90 tablet 1   sucralfate (CARAFATE) 1 g tablet Take 1 g by mouth 2 (two) times daily.     terbinafine (LAMISIL) 250 MG tablet Take 1 tablet (250  mg total) by mouth daily. 90 tablet 0   cephALEXin (KEFLEX) 500 MG capsule Take 1 capsule (500 mg total) by mouth 3 (three) times daily. 30 capsule 2   fluorouracil (EFUDEX) 5 % cream 5 % daily. Two times daily     Plant Sterols and Stanols (CHOLESTOFF) 450 MG TABS 2 tablets in the morning and at bedtime.     sulfamethoxazole-trimethoprim (BACTRIM DS) 800-160 MG tablet Take 1 tablet by mouth 2 (two) times daily. 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  Tobacco Use   Smoking status: Never   Smokeless tobacco: Never  Substance and Sexual Activity   Alcohol use: No    Alcohol/week: 0.0 standard drinks of alcohol   Drug use: No   Sexual activity: Not on file  Other Topics Concern   Not on file  Social History Narrative   Caffeine drink daily    Social Drivers of Corporate investment banker Strain: Not on file  Food Insecurity: Not on file  Transportation Needs: Not on file  Physical Activity: Not on file  Stress: Not on file  Social Connections: Not on file  Intimate Partner Violence: Not on file    Family History  Problem Relation Age of Onset   Heart disease Mother    Diabetes Father 61   Colon cancer Neg Hx     Review of Systems:  As stated in the HPI and otherwise negative.   BP 130/84   Pulse 73   Ht 5\' 11"  (1.803 m)   Wt 89.6 kg   SpO2 93%   BMI 27.56 kg/m   Physical Examination: General: Well developed, well nourished, NAD  HEENT: OP clear, mucus membranes moist  SKIN: warm, dry. No rashes. Neuro: No focal deficits  Musculoskeletal: Muscle strength 5/5 all ext  Psychiatric: Mood and affect normal  Neck: No JVD, no carotid bruits, no thyromegaly, no lymphadenopathy.  Lungs:Clear bilaterally, no wheezes, rhonci, crackles Cardiovascular: Regular rate and rhythm. No murmurs, gallops or rubs. Abdomen:Soft. Bowel sounds present. Non-tender.  Extremities: No lower extremity edema. Pulses are 2 + in the bilateral DP/PT.  EKG:  EKG is ordered today. The ekg ordered today demonstrates  EKG Interpretation Date/Time:  Wednesday August 08 2023 16:27:44 EDT Ventricular Rate:  74 PR Interval:  196 QRS Duration:  100 QT Interval:  386 QTC Calculation: 428 R Axis:   21  Text Interpretation: Normal sinus rhythm Normal ECG Confirmed by Verne Carrow [phone]) on 08/08/2023 4:39:34 PM   Recent Labs: No results found for requested labs within last 365 days.   Lipid Panel    Component Value Date/Time    CHOL 117 04/19/2021 0947   TRIG 140 04/19/2021 0947   HDL 36 (L) 04/19/2021 0947   CHOLHDL 3.3 04/19/2021 0947   CHOLHDL 4.3 07/14/2008 0355   VLDL 12 07/14/2008 0355  LDLCALC 56 04/19/2021 0947     Wt Readings from Last 3 Encounters:  08/08/23 89.6 kg  07/21/22 88.5 kg  07/15/21 89.4 kg    Assessment and Plan:   1. CAD without angina: He has mild CAD by coronary CTA in 2022. No chest pain suggestive of angina. Continue ASA, Zetia and statin.   2. HTN: BP is well controlled. Continue Norvasc and Losartan  3. Hyperlipidemia: Lipids followed in primary care. Lipids well controlled per pt. Labs are unavailable today. Continue statin and Zetia.   Labs/ tests ordered today include:   Orders Placed This Encounter  Procedures   EKG 12-Lead   Disposition:   F/U with me in 12 months    Signed, Verne Carrow, MD, [facility] 08/08/2023 4:46 PM    [facility] [address] [place], [place], [place]  [address] Phone: [phone]; Fax: [phone]

## 2023-08-08 NOTE — Patient Instructions (Signed)
 Medication Instructions:  No changes *If you need a refill on your cardiac medications before your next appointment, please call your pharmacy*  Lab Work: none If you have labs (blood work) drawn today and your tests are completely normal, you will receive your results only by: MyChart Message (if you have MyChart) OR A paper copy in the mail If you have any lab test that is abnormal or we need to change your treatment, we will call you to review the results.  Testing/Procedures: none  Follow-Up: At [facility], you and your health needs are our priority.  As part of our continuing mission to provide you with exceptional heart care, our providers are all part of one team.  This team includes your primary Cardiologist (physician) and Advanced Practice Providers or APPs (Physician Assistants and Nurse Practitioners) who all work together to provide you with the care you need, when you need it.  Your next appointment:   12 month(s)  Provider:   Verne Carrow, MD        1st Floor: - Lobby - Registration  - Pharmacy  - Lab - Cafe  2nd Floor: - PV Lab - Diagnostic Testing (echo, CT, nuclear med)  3rd Floor: - Vacant  4th Floor: - TCTS (cardiothoracic surgery) - AFib Clinic - Structural Heart Clinic - Vascular Surgery  - Vascular Ultrasound  5th Floor: - HeartCare Cardiology (general and EP) - Clinical Pharmacy for coumadin, hypertension, lipid, weight-loss medications, and med management appointments    Valet parking services will be available as well.

## 2023-08-16 ENCOUNTER — Encounter: Payer: Self-pay | Admitting: Podiatry

## 2023-08-16 ENCOUNTER — Other Ambulatory Visit: Payer: Self-pay | Admitting: Podiatry

## 2023-08-16 DIAGNOSIS — Z79899 Other long term (current) drug therapy: Secondary | ICD-10-CM

## 2023-08-16 LAB — HEPATIC FUNCTION PANEL
AG Ratio: 1.5 (calc) (ref 1.0–2.5)
ALT: 20 U/L (ref 9–46)
AST: 18 U/L (ref 10–35)
Albumin: 4.3 g/dL (ref 3.6–5.1)
Alkaline phosphatase (APISO): 70 U/L (ref 35–144)
Bilirubin, Direct: 0.3 mg/dL — ABNORMAL HIGH (ref 0.0–0.2)
Globulin: 2.8 g/dL (ref 1.9–3.7)
Indirect Bilirubin: 0.9 mg/dL (ref 0.2–1.2)
Total Bilirubin: 1.2 mg/dL (ref 0.2–1.2)
Total Protein: 7.1 g/dL (ref 6.1–8.1)

## 2023-08-16 LAB — CBC WITH DIFFERENTIAL/PLATELET
Absolute Lymphocytes: 1126 {cells}/uL (ref 850–3900)
Absolute Monocytes: 596 {cells}/uL (ref 200–950)
Basophils Absolute: 40 {cells}/uL (ref 0–200)
Basophils Relative: 0.6 %
Eosinophils Absolute: 147 {cells}/uL (ref 15–500)
Eosinophils Relative: 2.2 %
HCT: 45.2 % (ref 38.5–50.0)
Hemoglobin: 15 g/dL (ref 13.2–17.1)
MCH: 33 pg (ref 27.0–33.0)
MCHC: 33.2 g/dL (ref 32.0–36.0)
MCV: 99.3 fL (ref 80.0–100.0)
MPV: 11.4 fL (ref 7.5–12.5)
Monocytes Relative: 8.9 %
Neutro Abs: 4791 {cells}/uL (ref 1500–7800)
Neutrophils Relative %: 71.5 %
Platelets: 187 10*3/uL (ref 140–400)
RBC: 4.55 10*6/uL (ref 4.20–5.80)
RDW: 12.5 % (ref 11.0–15.0)
Total Lymphocyte: 16.8 %
WBC: 6.7 10*3/uL (ref 3.8–10.8)

## 2023-09-15 LAB — HEPATIC FUNCTION PANEL
AG Ratio: 1.3 (calc) (ref 1.0–2.5)
ALT: 24 U/L (ref 9–46)
AST: 25 U/L (ref 10–35)
Albumin: 3.8 g/dL (ref 3.6–5.1)
Alkaline phosphatase (APISO): 68 U/L (ref 35–144)
Bilirubin, Direct: 0.2 mg/dL (ref 0.0–0.2)
Globulin: 2.9 g/dL (ref 1.9–3.7)
Indirect Bilirubin: 0.6 mg/dL (ref 0.2–1.2)
Total Bilirubin: 0.8 mg/dL (ref 0.2–1.2)
Total Protein: 6.7 g/dL (ref 6.1–8.1)

## 2023-09-16 ENCOUNTER — Ambulatory Visit: Payer: Self-pay | Admitting: Podiatry

## 2023-09-18 ENCOUNTER — Other Ambulatory Visit: Payer: Self-pay | Admitting: Podiatry

## 2023-09-18 MED ORDER — TERBINAFINE HCL 250 MG PO TABS: 250.0000 mg | ORAL_TABLET | Freq: Every day | ORAL | 0 refills | Status: AC

## 2023-10-28 ENCOUNTER — Encounter: Payer: Self-pay | Admitting: Podiatry

## 2023-10-29 NOTE — Telephone Encounter (Signed)
 Can we schedule him a follow up? Thanks!

## 2023-11-13 ENCOUNTER — Encounter: Payer: Self-pay | Admitting: Podiatry

## 2023-11-13 ENCOUNTER — Ambulatory Visit (INDEPENDENT_AMBULATORY_CARE_PROVIDER_SITE_OTHER): Admitting: Podiatry

## 2023-11-13 VITALS — BP 130/88 | HR 74 | Temp 97.7°F | Resp 16 | Ht 71.0 in | Wt 197.0 lb

## 2023-11-13 DIAGNOSIS — B351 Tinea unguium: Secondary | ICD-10-CM | POA: Diagnosis not present

## 2023-11-13 DIAGNOSIS — M79675 Pain in left toe(s): Secondary | ICD-10-CM

## 2023-11-13 DIAGNOSIS — M79674 Pain in right toe(s): Secondary | ICD-10-CM

## 2023-11-13 MED ORDER — TAVABOROLE 5 % EX SOLN: 1.0000 [drp] | Freq: Every day | CUTANEOUS | 2 refills | Status: AC

## 2023-11-13 NOTE — Patient Instructions (Signed)
 Tavaborole  Topical solution What is this medication? TAVABOROLE  (ta va BO role) treats fungal infections of the nails. It belongs to a group of medications called antifungals. It will not treat infections caused by bacteria or viruses. This medicine may be used for other purposes; ask your health care provider or pharmacist if you have questions. COMMON BRAND NAME(S): KERYDIN  What should I tell my care team before I take this medication? They need to know if you have any of these conditions: An unusual or allergic reaction to tavaborole , other medications, foods, dyes, or preservatives Pregnant or trying to get pregnant Breast-feeding How should I use this medication? This medication is for external use only. Do not take by mouth. Wash your hands before and after use. If you are treating your hands, only wash your hands before use. Do not get it in your eyes. If you do, rinse your eyes with plenty of cool tap water. Use it as directed on the prescription label. Do not use it more often than directed. Use the medication for the full course as directed by your care team, even if you think you are better. Do not stop using it unless your care team tells you to stop it early. Apply a thin film of the medication to the affected area. Talk to your care team about the use of this medication in children. While it may be prescribed for children as young as 6 years for selected conditions, precautions do apply. Overdosage: If you think you have taken too much of this medicine contact a poison control center or emergency room at once. NOTE: This medicine is only for you. Do not share this medicine with others. What if I miss a dose? If you miss a dose, use it as soon as you can. If it is almost time for your next dose, use only that dose. Do not use double or extra doses. What may interact with this medication? Interactions have not been studied. Do not use any other nail products (i.e., nail polish,  pedicures) during treatment with this medication. This list may not describe all possible interactions. Give your health care provider a list of all the medicines, herbs, non-prescription drugs, or dietary supplements you use. Also tell them if you smoke, drink alcohol, or use illegal drugs. Some items may interact with your medicine. What should I watch for while using this medication? Visit your care team for regular checks on your progress. It may be some time before you see the benefit from this medication. After bathing, make sure your skin is very dry. Fungal infections like moist conditions. Do not walk around barefoot. To help prevent reinfection, wear freshly washed cotton, not synthetic, clothing. Tell your care team if you develop sores or blisters that do not heal properly. If your skin infection returns after you stop using this medication, contact your care team. What side effects may I notice from receiving this medication? Side effects that you should report to your care team as soon as possible: Allergic reactions--skin rash, itching, hives, swelling of the face, lips, tongue, or throat Burning, itching, crusting, or peeling of treated skin Side effects that usually do not require medical attention (report to your care team if they continue or are bothersome): Ingrown nails Mild skin irritation, redness, or dryness This list may not describe all possible side effects. Call your doctor for medical advice about side effects. You may report side effects to FDA at 1-800-FDA-1088. Where should I keep my medication? Keep out  of the reach of children and pets. Store at room temperature between 20 and 25 degrees C (68 and 77 degrees F). Keep this medication in the original container. Protect from moisture. Keep the container tightly closed. Avoid exposure to extreme heat. Get rid of any unused medication 3 months after opening. This medication is flammable. Avoid exposure to heat, fire,  flame, and smoking. To get rid of medications that are no longer needed or have expired: Take the medications to a medication take-back program. Check with your pharmacy or law enforcement to find a location. If you cannot return the medication, check the label or package insert to see if the medication should be thrown out in the garbage or flushed down the toilet. If you are not sure, ask your care team. If it is safe to put it in the trash, empty the medication out of the container. Mix the medication with cat litter, dirt, coffee grounds, or other unwanted substance. Seal the mixture in a bag or container. Put it in the trash. NOTE: This sheet is a summary. It may not cover all possible information. If you have questions about this medicine, talk to your doctor, pharmacist, or health care provider.  2024 Elsevier/Gold Standard (2021-08-04 00:00:00)

## 2023-11-14 ENCOUNTER — Encounter: Payer: Self-pay | Admitting: Podiatry

## 2023-11-17 NOTE — Progress Notes (Signed)
 Subjective: Chief Complaint  Patient presents with   Nail Problem    Patient is here for F/U for fungal medication, nail check and trim    78 year old male presents the office today for follow-up evaluation of nail fungus but also to have his nails trimmed as they are thick and he has difficulty trimming them himself.  He is not taking the Lamisil  without any side effects.  Objective: AAO x3, NAD DP/PT pulses palpable bilaterally, CRT less than 3 seconds Post partial nail avulsion which appears to be healing well.  The nail is do remain hypertrophic, dystrophic with yellow discoloration but there is clearing on the proximal nail folds.  There is no edema, erythema or any signs of infection. No pain with calf compression, swelling, warmth, erythema  Assessment: Symptomatic onychomycosis  Plan: -All treatment options discussed with the patient including all alternatives, risks, complications.  -As sharp debrided the nails x 10 without any complications or bleeding.  As he finishes Lamisil  we are switching to topical Kerydin  which I ordered today to allow the nails to continue to grow out.  Should he need we can always do another round of Lamisil  but appears to be getting better  Return in about 3 months (around 02/13/2024), or if symptoms worsen or fail to improve, for nail fungus.  Donnice JONELLE Fees DPM

## 2024-02-15 ENCOUNTER — Ambulatory Visit: Admitting: Podiatry

## 2024-02-15 VITALS — Ht 71.0 in | Wt 197.0 lb

## 2024-02-15 DIAGNOSIS — B351 Tinea unguium: Secondary | ICD-10-CM | POA: Diagnosis not present

## 2024-02-15 DIAGNOSIS — L600 Ingrowing nail: Secondary | ICD-10-CM | POA: Diagnosis not present

## 2024-02-15 NOTE — Progress Notes (Signed)
 Subjective: Chief Complaint  Patient presents with   Nail Problem    Rm 14 Patient is here for dermatophytosis of nail.   78 year old male presents the office today for follow-up evaluation of nail fungus.  States he is doing well and his nails not causing any pain.  Occasionally get ingrown but denies any swelling, redness or any drainage.  Objective: AAO x3, NAD DP/PT pulses palpable bilaterally, CRT less than 3 seconds Nails appear to be doing much better more clear in nature.  There is no pain to the nails.  There is minimal incurvation of the nails today.  There is no edema, erythema or signs of infection. No pain with calf compression, swelling, warmth, erythema  Assessment: Symptomatic onychomycosis  Plan: -All treatment options discussed with the patient including all alternatives, risks, complications.  -As sharp debrided the nails x 10 without any complications or bleeding as a courtesy. Continue topical medication all nails to continue to grow out. - Discussed monitoring for any signs or symptoms of infection of the ingrown toenails.  Return if symptoms worsen or fail to improve.   Donnice JONELLE Fees DPM
